# Patient Record
Sex: Female | Born: 1975 | Race: White | Hispanic: No | Marital: Married | Smoking: Former smoker
Health system: Southern US, Community
[De-identification: ages and names within clinical notes are randomized; demographics above are authoritative.]

## PROBLEM LIST (undated history)

## (undated) DIAGNOSIS — I10 Essential (primary) hypertension: Secondary | ICD-10-CM

## (undated) DIAGNOSIS — F17200 Nicotine dependence, unspecified, uncomplicated: Secondary | ICD-10-CM

## (undated) DIAGNOSIS — F419 Anxiety disorder, unspecified: Secondary | ICD-10-CM

## (undated) DIAGNOSIS — G709 Myoneural disorder, unspecified: Secondary | ICD-10-CM

## (undated) DIAGNOSIS — M3390 Dermatopolymyositis, unspecified, organ involvement unspecified: Secondary | ICD-10-CM

## (undated) DIAGNOSIS — M3313 Other dermatomyositis without myopathy: Secondary | ICD-10-CM

## (undated) DIAGNOSIS — K219 Gastro-esophageal reflux disease without esophagitis: Secondary | ICD-10-CM

## (undated) HISTORY — PX: DILATION AND CURETTAGE OF UTERUS: SHX78

## (undated) HISTORY — PX: TONSILLECTOMY: SUR1361

## (undated) HISTORY — PX: TUBAL LIGATION: SHX77

---

## 2005-07-16 ENCOUNTER — Emergency Department (HOSPITAL_COMMUNITY): Admission: EM | Admit: 2005-07-16 | Discharge: 2005-07-16 | Payer: Self-pay | Admitting: Emergency Medicine

## 2006-12-05 ENCOUNTER — Emergency Department (HOSPITAL_COMMUNITY): Admission: EM | Admit: 2006-12-05 | Discharge: 2006-12-05 | Payer: Self-pay | Admitting: Emergency Medicine

## 2007-05-19 ENCOUNTER — Emergency Department (HOSPITAL_COMMUNITY): Admission: EM | Admit: 2007-05-19 | Discharge: 2007-05-19 | Payer: Self-pay | Admitting: Emergency Medicine

## 2011-03-26 ENCOUNTER — Encounter (HOSPITAL_COMMUNITY): Payer: Self-pay | Admitting: *Deleted

## 2011-03-26 ENCOUNTER — Emergency Department (HOSPITAL_COMMUNITY): Payer: Commercial Managed Care - PPO

## 2011-03-26 ENCOUNTER — Emergency Department (HOSPITAL_COMMUNITY)
Admission: EM | Admit: 2011-03-26 | Discharge: 2011-03-26 | Disposition: A | Discharge status: Home / Self Care | Payer: Commercial Managed Care - PPO | Attending: Emergency Medicine | Admitting: Emergency Medicine

## 2011-03-26 DIAGNOSIS — F411 Generalized anxiety disorder: Secondary | ICD-10-CM | POA: Insufficient documentation

## 2011-03-26 DIAGNOSIS — I10 Essential (primary) hypertension: Secondary | ICD-10-CM | POA: Insufficient documentation

## 2011-03-26 DIAGNOSIS — K810 Acute cholecystitis: Secondary | ICD-10-CM | POA: Insufficient documentation

## 2011-03-26 DIAGNOSIS — F17200 Nicotine dependence, unspecified, uncomplicated: Secondary | ICD-10-CM | POA: Insufficient documentation

## 2011-03-26 HISTORY — DX: Anxiety disorder, unspecified: F41.9

## 2011-03-26 HISTORY — DX: Essential (primary) hypertension: I10

## 2011-03-26 LAB — DIFFERENTIAL
Basophils Absolute: 0 10*3/uL (ref 0.0–0.1)
Basophils Relative: 0 % (ref 0–1)
Eosinophils Absolute: 0.1 10*3/uL (ref 0.0–0.7)
Lymphocytes Relative: 20 % (ref 12–46)
Lymphs Abs: 1.7 10*3/uL (ref 0.7–4.0)
Monocytes Relative: 4 % (ref 3–12)

## 2011-03-26 LAB — URINE MICROSCOPIC-ADD ON

## 2011-03-26 LAB — COMPREHENSIVE METABOLIC PANEL
ALT: 146 U/L — ABNORMAL HIGH (ref 0–35)
Albumin: 3.9 g/dL (ref 3.5–5.2)
CO2: 22 mEq/L (ref 19–32)
Calcium: 9 mg/dL (ref 8.4–10.5)
GFR calc non Af Amer: >90 mL/min (ref >90)
Glucose, Bld: 93 mg/dL (ref 70–99)
Potassium: 4 mEq/L (ref 3.5–5.1)

## 2011-03-26 LAB — URINALYSIS, ROUTINE W REFLEX MICROSCOPIC
Bilirubin Urine: NEGATIVE
Ketones, ur: NEGATIVE mg/dL
Protein, ur: NEGATIVE mg/dL
Urobilinogen, UA: 0.2 mg/dL (ref 0.0–1.0)
pH: 6 (ref 5.0–8.0)

## 2011-03-26 LAB — CBC
HCT: 39 % (ref 36.0–46.0)
Hemoglobin: 13.2 g/dL (ref 12.0–15.0)
MCH: 30.3 pg (ref 26.0–34.0)
WBC: 8.2 10*3/uL (ref 4.0–10.5)

## 2011-03-26 MED ORDER — HYDROCODONE-ACETAMINOPHEN 5-325 MG PO TABS: 1.0000 | ORAL_TABLET | ORAL | Status: DC | PRN

## 2011-03-26 MED ORDER — IOHEXOL 300 MG/ML  SOLN
40.0000 mL | Freq: Once | INTRAMUSCULAR | Status: AC | PRN
  Administered 2011-03-26: 40 mL via ORAL

## 2011-03-26 MED ORDER — IOHEXOL 300 MG/ML  SOLN
100.0000 mL | Freq: Once | INTRAMUSCULAR | Status: AC | PRN
  Administered 2011-03-26: 100 mL via INTRAVENOUS

## 2011-03-26 MED ORDER — SODIUM CHLORIDE 0.9 % IV SOLN
INTRAVENOUS | Status: DC
  Administered 2011-03-26: 18:00:00 via INTRAVENOUS

## 2011-03-26 MED ORDER — HYDROMORPHONE HCL PF 1 MG/ML IJ SOLN
1.0000 mg | Freq: Once | INTRAMUSCULAR | Status: AC
  Administered 2011-03-26: 1 mg via INTRAVENOUS
  Filled 2011-03-26: qty 1

## 2011-03-26 MED ORDER — ONDANSETRON HCL 4 MG/2ML IJ SOLN
4.0000 mg | Freq: Once | INTRAMUSCULAR | Status: AC
  Administered 2011-03-26: 4 mg via INTRAVENOUS
  Filled 2011-03-26: qty 2

## 2011-03-26 NOTE — ED Notes (Signed)
Pt c/o sharp stabbing abd pain that woke her in the night.

## 2011-03-26 NOTE — ED Notes (Signed)
MD at bedside. 

## 2011-03-26 NOTE — ED Notes (Signed)
Pt up to the bathroom at this time.

## 2011-03-26 NOTE — ED Provider Notes (Cosign Needed)
History     CSN: [id number]  Arrival date & time 03/26/11  1310   First MD Initiated Contact with Patient 03/26/11 1703      Chief Complaint  Patient presents with  . Abdominal Pain  . Nausea    (Consider location/radiation/quality/duration/timing/severity/associated sxs/prior treatment) HPI Comments: Patient is a 36 year old woman who went to bed all right last night. She woke up in the middle of the night with a sharp intense pain, felt just above her umbilicus. The pain radiated around to the sides and into her back. The pain eases up and then becomes severe he can. She doesn't think it was caused by something she ate. She has not been anything today. She had no prior similar episode of pain.  Patient is a 36 y.o. female presenting with abdominal pain. The history is provided by the patient and the spouse. No language interpreter was used.  Abdominal Pain The primary symptoms of the illness include abdominal pain. The primary symptoms of the illness do not include fever, nausea, vomiting, diarrhea or dysuria. The current episode started 13 to 24 hours ago. The onset of the illness was sudden. Progression since onset: Abdominal pain waxes and wanes.  The illness is associated with awakening from sleep. The patient states that she believes she is currently not pregnant. The patient has not had a change in bowel habit. Additional symptoms associated with the illness include anorexia. Symptoms associated with the illness do not include chills.    Past Medical History  Diagnosis Date  . Anxiety   . Hypertension     History reviewed. No pertinent past surgical history.  History reviewed. No pertinent family history.  History  Substance Use Topics  . Smoking status: Current Everyday Smoker -- 1.0 packs/day  . Smokeless tobacco: Not on file  . Alcohol Use: No    OB History    Grav Para Term Preterm Abortions TAB SAB Ect Mult Living                  Review of Systems    Constitutional: Negative.  Negative for fever and chills.  HENT: Negative.   Eyes: Negative.   Respiratory: Negative.   Cardiovascular: Negative.   Gastrointestinal: Positive for abdominal pain and anorexia. Negative for nausea, vomiting and diarrhea.  Genitourinary: Negative.  Negative for dysuria and menstrual problem.       Her last period was about 2 and half weeks ago.  Musculoskeletal: Negative.   Neurological: Negative.   Psychiatric/Behavioral: Negative.     Allergies  Review of patient's allergies indicates no known allergies.  Home Medications  No current outpatient prescriptions on file.  BP 120/82  Pulse 73  Temp(Src) 98 F (36.7 C) (Oral)  Resp 18  Ht 5\' 8"  (1.727 m)  Wt 150 lb (68.04 kg)  BMI 22.81 kg/m2  SpO2 99%  LMP 03/13/2011  Physical Exam  Constitutional: She is oriented to person, place, and time. She appears well-developed and well-nourished.       And mild-moderate distress with abdominal pain.  HENT:  Head: Normocephalic and atraumatic.  Right Ear: External ear normal.  Left Ear: External ear normal.  Mouth/Throat: Oropharynx is clear and moist.  Eyes: Conjunctivae and EOM are normal. Pupils are equal, round, and reactive to light.  Neck: Normal range of motion. Neck supple.  Cardiovascular: Normal rate, regular rhythm and normal heart sounds.   Pulmonary/Chest: Effort normal and breath sounds normal.  Abdominal: Soft. Bowel sounds are normal.  She localizes the pain to the lower epigastric region, just above the umbilicus. There is mild tenderness there. There is no mass rebound or rigidity. Bowel sounds are hypoactive.  Musculoskeletal: Normal range of motion. She exhibits no edema and no tenderness.  Neurological: She is alert and oriented to person, place, and time.       No sensory or motor deficit.  Skin: Skin is warm and dry.  Psychiatric: She has a normal mood and affect. Her behavior is normal.    ED Course  Procedures  (including critical care time)   Labs Reviewed  CBC  DIFFERENTIAL  URINALYSIS, ROUTINE W REFLEX MICROSCOPIC  PREGNANCY, URINE  COMPREHENSIVE METABOLIC PANEL  LIPASE, BLOOD   5:34 PM Patient was seen, history was obtained and physical exam performed. IV fluids were, and IV medications for pain and nausea were ordered. Laboratory testing and CT of the abdomen and pelvis were ordered.  CT of abdomen and pelvis showed a solitary gallstone.  LFT's slightly elevated.  Discussed with Franky Macho, M.D., general surgeon, who will see pt in office at 2 PM on March 28, 2011.  Return instructions given to pt in event her pain recurs and is not controlled by pain medication.  1. Acute cholecystitis        Carleene Cooper III, MD 03/27/11 1039

## 2011-03-28 NOTE — H&P (Signed)
  NTS SOAP Note  Vital Signs:  Vitals as of: 03/28/2011: Systolic 134: Diastolic 82: Heart Rate 72: Temp 42F: Height 77ft 8in: Weight 156Lbs 0 Ounces: OFC 0in: Respiratory Rate 0: O2 Saturation 0: Pain Level 4: BMI 24  BMI : 23.72 kg/m2  Subjective: This 36 Years 86 Months old Female presents for of ABDOMINAL PAIN : ,Seen in ER several days ago for abdominal pain and nausea and found to have cholelithiasis with elevated LFT's.  No fever, chills, jaundice.  Started last week.  Review of Symptoms:  Constitutional:unremarkable Head:unremarkable Eyes:unremarkable Nose/Mouth/Throat:unremarkable Cardiovascular:unremarkable Respiratory:unremarkable Gastrointestinabdominal pain,nausea,vomiting Genitourinary:unremarkable Musculoskeletal:unremarkable Skin:unremarkable Hematolgic/Lymphatic:unremarkable Allergic/Immunologic:unremarkable   Past Medical History:Reviewed   Past Medical History  Surgical History: c- sections, T and A Medical Problems: HTN Psychiatric History:  Anxiety Allergies: nkda Medications: wellbutrin, bystolic, xanax, hydrocodone   Social History:Reviewed   Social History  Preferred Language: English ([place]) Race:  White Ethnicity: Not Hispanic / Latino Age: 36 Years 0 Months Marital Status:  M Alcohol:  No Recreational drug(s):  No   Smoking Status: Current every day smoker reviewed on 03/28/2011 Started Date: 02/27/1993 Packs per day: 1.00   Family History:Reviewed   Family History              Father:  Psychologist, counselling, lymphoma             Mother:  Hypertension, Diabetes Type II, Coronary Artery Disease    Objective Information: General:Well appearing, well nourished in no distress. Head:Atraumatic; no masses; no abnormalities no scleral icterus Neck:Supple without lymphadenopathy.  Heart:RRR, no murmur or gallop.  Normal S1, S2.  No S3, S4.  Lungs:CTA bilaterally, no  wheezes, rhonchi, rales.  Breathing unlabored. Abdomen:Soft, tender in right upper quadrant to deep palpation, ND, normal bowel sounds, no HSM, no masses.  No peritoneal signs.  Assessment:Cholecystitis, cholelithiasis  Diagnosis &amp; Procedure: DiagnosisCode: 574.10, ProcedureCode: 16109,    Plan:Scheduled for laparoscopic cholecysectomy with cholangiograms on 03/31/11.   Patient Education:Alternative treatments to surgery were discussed with patient (and family).Risks and benefits  of procedure were fully explained to the patient (and family) who gave informed consent. Patient/family questions were addressed.  Follow-up:Pending Surgery

## 2011-03-29 ENCOUNTER — Encounter (HOSPITAL_COMMUNITY): Payer: Self-pay | Admitting: Pharmacy Technician

## 2011-03-30 ENCOUNTER — Encounter (HOSPITAL_COMMUNITY)
Admission: RE | Admit: 2011-03-30 | Discharge: 2011-03-30 | Disposition: A | Discharge status: Home / Self Care | Payer: Commercial Managed Care - PPO | Source: Ambulatory Visit | Attending: General Surgery | Admitting: General Surgery

## 2011-03-30 ENCOUNTER — Encounter (HOSPITAL_COMMUNITY): Payer: Self-pay

## 2011-03-30 ENCOUNTER — Other Ambulatory Visit: Payer: Self-pay

## 2011-03-30 HISTORY — DX: Gastro-esophageal reflux disease without esophagitis: K21.9

## 2011-03-30 LAB — SURGICAL PCR SCREEN: MRSA, PCR: NEGATIVE

## 2011-03-30 LAB — HEPATIC FUNCTION PANEL
Bilirubin, Direct: <0.1 mg/dL (ref 0.0–0.3)
Total Bilirubin: 0.2 mg/dL — ABNORMAL LOW (ref 0.3–1.2)

## 2011-03-30 NOTE — Patient Instructions (Signed)
18 Kathryn Austin  03/30/2011   Your procedure is scheduled on:  03/31/2011  Report to [facility] at [place] [facility].  Call this number if you have problems the morning of surgery: [phone]   Remember:   Do not eat food:After Midnight.  May have clear liquids:until Midnight .  Clear liquids include soda, tea, black coffee, apple or grape juice, broth.  Take these medicines the morning of surgery with A SIP OF WATER: Bupropion,nebivolol. Take Xanax and Hydrocodone, if needed.   Do not wear jewelry, make-up or nail polish.  Do not wear lotions, powders, or perfumes. You may wear deodorant.  Do not shave 48 hours prior to surgery.  Do not bring valuables to the hospital.  Contacts, dentures or bridgework may not be worn into surgery.  Leave suitcase in the car. After surgery it may be brought to your room.  For patients admitted to the hospital, checkout time is 11:00 AM the day of discharge.   Patients discharged the day of surgery will not be allowed to drive home.  Name and phone number of your driver: Family  Special Instructions: CHG Shower Use Special Wash: 1/2 bottle night before surgery and 1/2 bottle morning of surgery.   Please read over the following fact sheets that you were given: Pain Booklet, MRSA Information, Surgical Site Infection Prevention, Anesthesia Post-op Instructions and Care and Recovery After Surgery    Laparoscopic Cholecystectomy Laparoscopic cholecystectomy is surgery to remove the gallbladder. The gallbladder is located slightly to the right of center in the abdomen, behind the liver. It is a concentrating and storage sac for the bile produced in the liver. Bile aids in the digestion and absorption of fats. Gallbladder disease (cholecystitis) is an inflammation of your gallbladder. This condition is usually caused by a buildup of gallstones (cholelithiasis) in your gallbladder. Gallstones can block the flow of bile, resulting in inflammation and pain. In severe  cases, emergency surgery may be required. When emergency surgery is not required, you will have time to prepare for the procedure. Laparoscopic surgery is an alternative to open surgery. Laparoscopic surgery usually has a shorter recovery time. Your common bile duct may also need to be examined and explored. Your caregiver will discuss this with you if he or she feels this should be done. If stones are found in the common bile duct, they may be removed. LET YOUR CAREGIVER KNOW ABOUT:  Allergies to food or medicine.   Medicines taken, including vitamins, herbs, eyedrops, over-the-counter medicines, and creams.   Use of steroids (by mouth or creams).   Previous problems with anesthetics or numbing medicines.   History of bleeding problems or blood clots.   Previous surgery.   Other health problems, including diabetes and kidney problems.   Possibility of pregnancy, if this applies.  RISKS AND COMPLICATIONS All surgery is associated with risks. Some problems that may occur following this procedure include:  Infection.   Damage to the common bile duct, nerves, arteries, veins, or other internal organs such as the stomach or intestines.   Bleeding.   A stone may remain in the common bile duct.  BEFORE THE PROCEDURE  Do not take aspirin for 3 days prior to surgery or blood thinners for 1 week prior to surgery.   Do not eat or drink anything after midnight the night before surgery.   Let your caregiver know if you develop a cold or other infectious problem prior to surgery.   You should be present 60 minutes  before the procedure or as directed.  PROCEDURE  You will be given medicine that makes you sleep (general anesthetic). When you are asleep, your surgeon will make several small cuts (incisions) in your abdomen. One of these incisions is used to insert a small, lighted scope (laparoscope) into the abdomen. The laparoscope helps the surgeon see into your abdomen. Carbon dioxide gas  will be pumped into your abdomen. The gas allows more room for the surgeon to perform your surgery. Other operating instruments are inserted through the other incisions. Laparoscopic procedures may not be appropriate when:  There is major scarring from previous surgery.   The gallbladder is extremely inflamed.   There are bleeding disorders or unexpected cirrhosis of the liver.   A pregnancy is near term.   Other conditions make the laparoscopic procedure impossible.  If your surgeon feels it is not safe to continue with a laparoscopic procedure, he or she will perform an open abdominal procedure. In this case, the surgeon will make an incision to open the abdomen. This gives the surgeon a larger view and field to work within. This may allow the surgeon to perform procedures that sometimes cannot be performed with a laparoscope alone. Open surgery has a longer recovery time. AFTER THE PROCEDURE  You will be taken to the recovery area where a nurse will watch and check your progress.   You may be allowed to go home the same day.   Do not resume physical activities until directed by your caregiver.   You may resume a normal diet and activities as directed.  Document Released: 02/13/2005 Document Revised: 10/26/2010 Document Reviewed: 07/29/2010 [facility] Patient Information 2012 [place], [place].   PATIENT INSTRUCTIONS POST-ANESTHESIA  IMMEDIATELY FOLLOWING SURGERY:  Do not drive or operate machinery for the first twenty four hours after surgery.  Do not make any important decisions for twenty four hours after surgery or while taking narcotic pain medications or sedatives.  If you develop intractable nausea and vomiting or a severe headache please notify your doctor immediately.  FOLLOW-UP:  Please make an appointment with your surgeon as instructed. You do not need to follow up with anesthesia unless specifically instructed to do so.  WOUND CARE INSTRUCTIONS (if applicable):  Keep a dry  clean dressing on the anesthesia/puncture wound site if there is drainage.  Once the wound has quit draining you may leave it open to air.  Generally you should leave the bandage intact for twenty four hours unless there is drainage.  If the epidural site drains for more than 36-48 hours please call the anesthesia department.  QUESTIONS?:  Please feel free to call your physician or the hospital operator if you have any questions, and they will be happy to assist you.     [facility] Anesthesia Department [address] [place] [phone]

## 2011-03-31 ENCOUNTER — Encounter (HOSPITAL_COMMUNITY): Payer: Self-pay | Admitting: *Deleted

## 2011-03-31 ENCOUNTER — Ambulatory Visit (HOSPITAL_COMMUNITY): Payer: Commercial Managed Care - PPO | Admitting: Anesthesiology

## 2011-03-31 ENCOUNTER — Ambulatory Visit (HOSPITAL_COMMUNITY)
Admission: RE | Admit: 2011-03-31 | Discharge: 2011-03-31 | Disposition: A | Discharge status: Home / Self Care | Payer: Commercial Managed Care - PPO | Source: Ambulatory Visit | Attending: General Surgery | Admitting: General Surgery

## 2011-03-31 ENCOUNTER — Encounter (HOSPITAL_COMMUNITY)
Admission: RE | Disposition: A | Discharge status: Home / Self Care | Payer: Self-pay | Source: Ambulatory Visit | Attending: General Surgery

## 2011-03-31 ENCOUNTER — Encounter (HOSPITAL_COMMUNITY): Payer: Self-pay | Admitting: Anesthesiology

## 2011-03-31 ENCOUNTER — Other Ambulatory Visit: Payer: Self-pay | Admitting: General Surgery

## 2011-03-31 DIAGNOSIS — I10 Essential (primary) hypertension: Secondary | ICD-10-CM | POA: Insufficient documentation

## 2011-03-31 DIAGNOSIS — K8010 Calculus of gallbladder with chronic cholecystitis without obstruction: Secondary | ICD-10-CM | POA: Insufficient documentation

## 2011-03-31 DIAGNOSIS — Z79899 Other long term (current) drug therapy: Secondary | ICD-10-CM | POA: Insufficient documentation

## 2011-03-31 DIAGNOSIS — Z01810 Encounter for preprocedural cardiovascular examination: Secondary | ICD-10-CM | POA: Insufficient documentation

## 2011-03-31 DIAGNOSIS — Z01812 Encounter for preprocedural laboratory examination: Secondary | ICD-10-CM | POA: Insufficient documentation

## 2011-03-31 HISTORY — PX: CHOLECYSTECTOMY: SHX55

## 2011-03-31 SURGERY — LAPAROSCOPIC CHOLECYSTECTOMY
Anesthesia: General | Wound class: Clean Contaminated

## 2011-03-31 MED ORDER — FENTANYL CITRATE 0.05 MG/ML IJ SOLN
INTRAMUSCULAR | Status: DC | PRN
  Administered 2011-03-31 (×4): 50 ug via INTRAVENOUS

## 2011-03-31 MED ORDER — 0.9 % SODIUM CHLORIDE (POUR BTL) OPTIME
TOPICAL | Status: DC | PRN
  Administered 2011-03-31: 1000 mL

## 2011-03-31 MED ORDER — GLYCOPYRROLATE 0.2 MG/ML IJ SOLN
INTRAMUSCULAR | Status: DC | PRN
  Administered 2011-03-31: .6 mg via INTRAVENOUS

## 2011-03-31 MED ORDER — PROPOFOL 10 MG/ML IV BOLUS
INTRAVENOUS | Status: DC | PRN
  Administered 2011-03-31: 150 mg via INTRAVENOUS

## 2011-03-31 MED ORDER — ONDANSETRON HCL 4 MG/2ML IJ SOLN
INTRAMUSCULAR | Status: AC
  Administered 2011-03-31: 4 mg via INTRAVENOUS
  Filled 2011-03-31: qty 2

## 2011-03-31 MED ORDER — HEMOSTATIC AGENTS (NO CHARGE) OPTIME
TOPICAL | Status: DC | PRN
  Administered 2011-03-31: 1 via TOPICAL

## 2011-03-31 MED ORDER — CEFAZOLIN SODIUM-DEXTROSE 2-3 GM-% IV SOLR: 2.0000 g | INTRAVENOUS | Status: DC

## 2011-03-31 MED ORDER — ENOXAPARIN SODIUM 40 MG/0.4ML ~~LOC~~ SOLN
40.0000 mg | Freq: Once | SUBCUTANEOUS | Status: AC
  Administered 2011-03-31: 40 mg via SUBCUTANEOUS

## 2011-03-31 MED ORDER — FENTANYL CITRATE 0.05 MG/ML IJ SOLN
INTRAMUSCULAR | Status: AC
  Administered 2011-03-31: 25 ug via INTRAVENOUS
  Filled 2011-03-31: qty 2

## 2011-03-31 MED ORDER — GLYCOPYRROLATE 0.2 MG/ML IJ SOLN
INTRAMUSCULAR | Status: AC
  Administered 2011-03-31: 0.2 mg via INTRAVENOUS
  Filled 2011-03-31: qty 1

## 2011-03-31 MED ORDER — ENOXAPARIN SODIUM 40 MG/0.4ML ~~LOC~~ SOLN
SUBCUTANEOUS | Status: AC
  Administered 2011-03-31: 40 mg via SUBCUTANEOUS
  Filled 2011-03-31: qty 0.4

## 2011-03-31 MED ORDER — FENTANYL CITRATE 0.05 MG/ML IJ SOLN
INTRAMUSCULAR | Status: AC
  Administered 2011-03-31: 50 ug via INTRAVENOUS
  Filled 2011-03-31: qty 2

## 2011-03-31 MED ORDER — LACTATED RINGERS IV SOLN
INTRAVENOUS | Status: DC
  Administered 2011-03-31: 1000 mL via INTRAVENOUS

## 2011-03-31 MED ORDER — CEFAZOLIN SODIUM 1-5 GM-% IV SOLN
INTRAVENOUS | Status: AC
  Filled 2011-03-31: qty 100

## 2011-03-31 MED ORDER — LIDOCAINE HCL 1 % IJ SOLN
INTRAMUSCULAR | Status: DC | PRN
  Administered 2011-03-31: 50 mg via INTRADERMAL

## 2011-03-31 MED ORDER — MIDAZOLAM HCL 2 MG/2ML IJ SOLN
INTRAMUSCULAR | Status: AC
  Administered 2011-03-31: 2 mg via INTRAVENOUS
  Filled 2011-03-31: qty 2

## 2011-03-31 MED ORDER — NEOSTIGMINE METHYLSULFATE 1 MG/ML IJ SOLN
INTRAMUSCULAR | Status: AC
  Filled 2011-03-31: qty 10

## 2011-03-31 MED ORDER — ONDANSETRON HCL 4 MG/2ML IJ SOLN
4.0000 mg | Freq: Once | INTRAMUSCULAR | Status: AC
  Administered 2011-03-31: 4 mg via INTRAVENOUS

## 2011-03-31 MED ORDER — ROCURONIUM BROMIDE 100 MG/10ML IV SOLN
INTRAVENOUS | Status: DC | PRN
  Administered 2011-03-31: 30 mg via INTRAVENOUS

## 2011-03-31 MED ORDER — BUPIVACAINE-EPINEPHRINE (PF) 0.5% -1:200000 IJ SOLN
INTRAMUSCULAR | Status: DC | PRN
  Administered 2011-03-31: 10 mL

## 2011-03-31 MED ORDER — HYDROCODONE-ACETAMINOPHEN 5-325 MG PO TABS: 1.0000 | ORAL_TABLET | ORAL | Status: AC | PRN

## 2011-03-31 MED ORDER — ONDANSETRON HCL 4 MG/2ML IJ SOLN: 4.0000 mg | Freq: Once | INTRAMUSCULAR | Status: DC | PRN

## 2011-03-31 MED ORDER — MIDAZOLAM HCL 2 MG/2ML IJ SOLN
1.0000 mg | INTRAMUSCULAR | Status: DC | PRN
  Administered 2011-03-31: 2 mg via INTRAVENOUS

## 2011-03-31 MED ORDER — CEFAZOLIN SODIUM 1-5 GM-% IV SOLN
INTRAVENOUS | Status: DC | PRN
  Administered 2011-03-31: 2 g via INTRAVENOUS

## 2011-03-31 MED ORDER — FENTANYL CITRATE 0.05 MG/ML IJ SOLN
INTRAMUSCULAR | Status: AC
  Filled 2011-03-31: qty 5

## 2011-03-31 MED ORDER — GLYCOPYRROLATE 0.2 MG/ML IJ SOLN
0.2000 mg | Freq: Once | INTRAMUSCULAR | Status: AC
  Administered 2011-03-31: 0.2 mg via INTRAVENOUS

## 2011-03-31 MED ORDER — BUPIVACAINE HCL (PF) 0.5 % IJ SOLN
INTRAMUSCULAR | Status: AC
  Filled 2011-03-31: qty 30

## 2011-03-31 MED ORDER — NEOSTIGMINE METHYLSULFATE 1 MG/ML IJ SOLN
INTRAMUSCULAR | Status: DC | PRN
  Administered 2011-03-31: 3 mg via INTRAVENOUS

## 2011-03-31 MED ORDER — KETOROLAC TROMETHAMINE 30 MG/ML IJ SOLN
INTRAMUSCULAR | Status: AC
  Administered 2011-03-31: 30 mg via INTRAVENOUS
  Filled 2011-03-31: qty 1

## 2011-03-31 MED ORDER — FENTANYL CITRATE 0.05 MG/ML IJ SOLN
25.0000 ug | INTRAMUSCULAR | Status: DC | PRN
  Administered 2011-03-31: 50 ug via INTRAVENOUS
  Administered 2011-03-31: 25 ug via INTRAVENOUS
  Administered 2011-03-31: 50 ug via INTRAVENOUS

## 2011-03-31 MED ORDER — GLYCOPYRROLATE 0.2 MG/ML IJ SOLN
INTRAMUSCULAR | Status: AC
  Filled 2011-03-31: qty 2

## 2011-03-31 MED ORDER — ACETAMINOPHEN 325 MG PO TABS: 325.0000 mg | ORAL_TABLET | ORAL | Status: DC | PRN

## 2011-03-31 MED ORDER — KETOROLAC TROMETHAMINE 30 MG/ML IJ SOLN
30.0000 mg | Freq: Once | INTRAMUSCULAR | Status: AC
  Administered 2011-03-31: 30 mg via INTRAVENOUS

## 2011-03-31 SURGICAL SUPPLY — 43 items
APPLIER CLIP ROT 10 11.4 M/L (STAPLE) ×2
APR CLP MED LRG 11.4X10 (STAPLE) ×1
BAG HAMPER (MISCELLANEOUS) ×2 IMPLANT
BAG SPEC RTRVL LRG 6X4 10 (ENDOMECHANICALS) ×1
CATH CHOLANGIOGRAM 4.5FR (CATHETERS) ×1 IMPLANT
CLIP APPLIE ROT 10 11.4 M/L (STAPLE) ×1 IMPLANT
CLOTH BEACON ORANGE TIMEOUT ST (SAFETY) ×2 IMPLANT
COVER LIGHT HANDLE STERIS (MISCELLANEOUS) ×4 IMPLANT
COVER MAYO STAND XLG (DRAPE) ×2 IMPLANT
DECANTER SPIKE VIAL GLASS SM (MISCELLANEOUS) ×1 IMPLANT
DISSECTOR BLUNT TIP ENDO 5MM (MISCELLANEOUS) IMPLANT
DRAPE C-ARM FOLDED MOBILE STRL (DRAPES) ×1 IMPLANT
DURAPREP 26ML APPLICATOR (WOUND CARE) ×2 IMPLANT
ELECT REM PT RETURN 9FT ADLT (ELECTROSURGICAL) ×2
ELECTRODE REM PT RTRN 9FT ADLT (ELECTROSURGICAL) ×1 IMPLANT
FILTER SMOKE EVAC LAPAROSHD (FILTER) ×2 IMPLANT
FORMALIN 10 PREFIL 120ML (MISCELLANEOUS) ×2 IMPLANT
GLOVE BIO SURGEON STRL SZ7.5 (GLOVE) ×2 IMPLANT
GLOVE BIOGEL PI IND STRL 7.0 (GLOVE) IMPLANT
GLOVE BIOGEL PI IND STRL 7.5 (GLOVE) IMPLANT
GLOVE BIOGEL PI INDICATOR 7.0 (GLOVE) ×1
GLOVE BIOGEL PI INDICATOR 7.5 (GLOVE) ×2
GLOVE ECLIPSE 6.5 STRL STRAW (GLOVE) ×1 IMPLANT
GLOVE ECLIPSE 7.0 STRL STRAW (GLOVE) ×2 IMPLANT
GOWN STRL REIN XL XLG (GOWN DISPOSABLE) ×7 IMPLANT
HEMOSTAT SNOW SURGICEL 2X4 (HEMOSTASIS) ×2 IMPLANT
INST SET LAPROSCOPIC AP (KITS) ×2 IMPLANT
KIT ROOM TURNOVER APOR (KITS) ×2 IMPLANT
KIT TROCAR LAP CHOLE (TROCAR) ×2 IMPLANT
MANIFOLD NEPTUNE II (INSTRUMENTS) ×2 IMPLANT
NS IRRIG 1000ML POUR BTL (IV SOLUTION) ×2 IMPLANT
PACK LAP CHOLE LZT030E (CUSTOM PROCEDURE TRAY) ×2 IMPLANT
PAD ARMBOARD 7.5X6 YLW CONV (MISCELLANEOUS) ×2 IMPLANT
POUCH SPECIMEN RETRIEVAL 10MM (ENDOMECHANICALS) ×2 IMPLANT
SET BASIN LINEN APH (SET/KITS/TRAYS/PACK) ×2 IMPLANT
SET TUBE IRRIG SUCTION NO TIP (IRRIGATION / IRRIGATOR) IMPLANT
SPONGE GAUZE 2X2 8PLY STRL LF (GAUZE/BANDAGES/DRESSINGS) ×8 IMPLANT
STAPLER VISISTAT (STAPLE) ×2 IMPLANT
SUT VICRYL 0 UR6 27IN ABS (SUTURE) ×2 IMPLANT
SYR 20CC LL (SYRINGE) ×2 IMPLANT
SYR 30ML LL (SYRINGE) ×2 IMPLANT
WARMER LAPAROSCOPE (MISCELLANEOUS) ×2 IMPLANT
YANKAUER SUCT 12FT TUBE ARGYLE (SUCTIONS) ×2 IMPLANT

## 2011-03-31 NOTE — Op Note (Signed)
Patient:  Kathryn Austin  DOB:  [birth year]  MRN:  [id number]   Preop Diagnosis:  Cholecystitis, cholelithiasis  Postop Diagnosis:  Same  Procedure:  Laparoscopic cholecystectomy  Surgeon:  Franky Macho, M.D.  Anes:  General endotracheal  Indications:  Patient is a 36 year old white female presents with biliary colic secondary to cholelithiasis. Her liver enzyme tests were normal at the time of surgery. The risks and benefits of the procedure including bleeding, infection, hepatobiliary injury, the possibly of an open procedure were fully explained to the patient, gave informed consent.  Procedure note:  Patient is placed the supine position. After induction of general endotracheal anesthesia, the abdomen was prepped and draped using usual sterile technique with DuraPrep. Surgical site confirmation was performed.  An infraumbilical incision was made down to the fascia. A Veress needle was introduced into the abdominal cavity and confirmation of placement was done using the saline drop test. The abdomen was then insufflated to 16 mm mercury pressure. An 11 mm trocar was introduced into the abdominal cavity under direct visualization without difficulty. Patient is placed in reverse Trendelenburg position additional 11 mm trocar was placed in the epigastric region 5 mm trochars placed were upper quadrant and right flank regions. Liver was inspected and noted within normal limits. The gallbladder was retracted superior laterally. Dissection was begun around the infundibulum of the gallbladder. The cystic duct was first identified. Junction to the infundibulum flow identified. Endoclips placed proximally distally on the cystic duct and cystic duct was divided. This was likewise done to the cystic artery. The gallbladder was then freed away from the gallbladder fossa using Bovie electrocautery. The gallbladder delivered through the epigastric are site using an Endo Catch bag. The gallbladder fossa was  inspected and no abnormal bleeding or bile leakage was noted. Surgicel is placed the gallbladder fossa. All fluid and air were then evacuated from the abdominal cavity prior to removal of the trochars.  All wounds were gave normal saline. All wounds were injected with 0.5% Sensorcaine. The infraumbilical fashion as well as epigastric fascia were reapproximated using 0 Vicryl interrupted sutures. All skin incisions were closed using staples. Betadine ointment after dressings were applied.  All tape and needle counts were correct at the end of the procedure. Patient was extubated in the operating room went back to recovery room awake in stable condition.  Complications:  None  EBL:  Minimal  Specimen:  Gallbladder

## 2011-03-31 NOTE — Anesthesia Preprocedure Evaluation (Addendum)
Anesthesia Evaluation  Patient identified by MRN, date of birth, ID band Patient awake    Reviewed: Allergy & Precautions, H&P , NPO status , Patient's Chart, lab work & pertinent test results, reviewed documented beta blocker date and time   Airway Mallampati: I TM Distance: >3 FB Neck ROM: Full    Dental No notable dental hx.    Pulmonary neg pulmonary ROS,    Pulmonary exam normal       Cardiovascular hypertension, Pt. on medications and Pt. on home beta blockers Regular Normal    Neuro/Psych Anxiety Negative Neurological ROS     GI/Hepatic Neg liver ROS, GERD-  ,  Endo/Other  Negative Endocrine ROS  Renal/GU negative Renal ROS  Genitourinary negative   Musculoskeletal negative musculoskeletal ROS (+)   Abdominal Normal abdominal exam  (+)   Peds  Hematology negative hematology ROS (+)   Anesthesia Other Findings   Reproductive/Obstetrics negative OB ROS                           Anesthesia Physical Anesthesia Plan  ASA: II  Anesthesia Plan: General   Post-op Pain Management:    Induction: Intravenous, Rapid sequence and Cricoid pressure planned  Airway Management Planned: Oral ETT  Additional Equipment:   Intra-op Plan:   Post-operative Plan: Extubation in OR  Informed Consent: I have reviewed the patients History and Physical, chart, labs and discussed the procedure including the risks, benefits and alternatives for the proposed anesthesia with the patient or authorized representative who has indicated his/her understanding and acceptance.   Dental advisory given  Plan Discussed with: CRNA  Anesthesia Plan Comments:         Anesthesia Quick Evaluation

## 2011-03-31 NOTE — Anesthesia Postprocedure Evaluation (Signed)
  Anesthesia Post-op Note  Patient: Kathryn Austin  Procedure(s) Performed:  LAPAROSCOPIC CHOLECYSTECTOMY  Patient Location: PACU  Anesthesia Type: General  Level of Consciousness: awake  Airway and Oxygen Therapy: Patient Spontanous Breathing and Patient connected to face mask oxygen  Post-op Pain: none  Post-op Assessment: Post-op Vital signs reviewed, Patient's Cardiovascular Status Stable, Respiratory Function Stable and No signs of Nausea or vomiting  Post-op Vital Signs: Reviewed and stable  Complications: No apparent anesthesia complications

## 2011-03-31 NOTE — Anesthesia Procedure Notes (Signed)
Procedure Name: Intubation Date/Time: 03/31/2011 7:56 AM Performed by: Glynn Octave Pre-anesthesia Checklist: Patient identified, Patient being monitored, Timeout performed, Emergency Drugs available and Suction available Patient Re-evaluated:Patient Re-evaluated prior to inductionOxygen Delivery Method: Circle System Utilized Preoxygenation: Pre-oxygenation with 100% oxygen Intubation Type: IV induction, Rapid sequence and Cricoid Pressure applied Laryngoscope Size: Mac and 3 Grade View: Grade I Tube type: Oral Tube size: 7.0 mm Number of attempts: 1 Airway Equipment and Method: stylet Placement Confirmation: ETT inserted through vocal cords under direct vision,  positive ETCO2 and breath sounds checked- equal and bilateral Secured at: 21 cm Tube secured with: Tape Dental Injury: Teeth and Oropharynx as per pre-operative assessment

## 2011-03-31 NOTE — Interval H&P Note (Signed)
History and Physical Interval Note:  03/31/2011 7:30 AM  Kathryn Austin  has presented today for surgery, with the diagnosis of Calculus of gallbladder with other cholecystitis, without mention of obstruction   The various methods of treatment have been discussed with the patient and family. After consideration of risks, benefits and other options for treatment, the patient has consented to  Procedure(s): LAPAROSCOPIC CHOLECYSTECTOMY WITH INTRAOPERATIVE CHOLANGIOGRAM as a surgical intervention .  The patients' history has been reviewed, patient examined, no change in status, stable for surgery.  I have reviewed the patients' chart and labs.  Questions were answered to the patient's satisfaction.     [name]

## 2011-03-31 NOTE — Transfer of Care (Signed)
Immediate Anesthesia Transfer of Care Note  Patient: Kathryn Austin  Procedure(s) Performed:  LAPAROSCOPIC CHOLECYSTECTOMY  Patient Location: PACU  Anesthesia Type: General  Level of Consciousness: awake, alert  and oriented  Airway & Oxygen Therapy: Patient Spontanous Breathing and Patient connected to face mask oxygen  Post-op Assessment: Report given to PACU RN  Post vital signs: Reviewed and stable  Complications: No apparent anesthesia complications

## 2011-04-03 ENCOUNTER — Encounter (HOSPITAL_COMMUNITY): Payer: Self-pay | Admitting: General Surgery

## 2011-08-09 ENCOUNTER — Other Ambulatory Visit (HOSPITAL_COMMUNITY): Payer: Self-pay | Admitting: Family Medicine

## 2011-08-09 DIAGNOSIS — R109 Unspecified abdominal pain: Secondary | ICD-10-CM

## 2011-08-15 ENCOUNTER — Other Ambulatory Visit (HOSPITAL_COMMUNITY): Payer: Self-pay | Admitting: Family Medicine

## 2011-08-15 ENCOUNTER — Ambulatory Visit (HOSPITAL_COMMUNITY)
Admission: RE | Admit: 2011-08-15 | Discharge: 2011-08-15 | Disposition: A | Discharge status: Home / Self Care | Payer: Commercial Managed Care - PPO | Source: Ambulatory Visit | Attending: Family Medicine | Admitting: Family Medicine

## 2011-08-15 DIAGNOSIS — N949 Unspecified condition associated with female genital organs and menstrual cycle: Secondary | ICD-10-CM | POA: Insufficient documentation

## 2011-08-15 DIAGNOSIS — R109 Unspecified abdominal pain: Secondary | ICD-10-CM

## 2011-08-15 DIAGNOSIS — R9389 Abnormal findings on diagnostic imaging of other specified body structures: Secondary | ICD-10-CM | POA: Insufficient documentation

## 2011-11-27 ENCOUNTER — Other Ambulatory Visit (HOSPITAL_COMMUNITY): Payer: Self-pay | Admitting: Physician Assistant

## 2011-11-27 DIAGNOSIS — R5381 Other malaise: Secondary | ICD-10-CM

## 2011-11-28 ENCOUNTER — Ambulatory Visit (HOSPITAL_COMMUNITY)
Admission: RE | Admit: 2011-11-28 | Discharge: 2011-11-28 | Disposition: A | Discharge status: Home / Self Care | Payer: Commercial Managed Care - PPO | Source: Ambulatory Visit | Attending: Physician Assistant | Admitting: Physician Assistant

## 2011-11-28 DIAGNOSIS — E079 Disorder of thyroid, unspecified: Secondary | ICD-10-CM | POA: Insufficient documentation

## 2011-11-28 DIAGNOSIS — R5383 Other fatigue: Secondary | ICD-10-CM | POA: Insufficient documentation

## 2011-11-28 DIAGNOSIS — R5381 Other malaise: Secondary | ICD-10-CM | POA: Insufficient documentation

## 2011-11-30 ENCOUNTER — Ambulatory Visit (HOSPITAL_COMMUNITY): Payer: Commercial Managed Care - PPO

## 2012-02-09 ENCOUNTER — Other Ambulatory Visit: Payer: Self-pay

## 2012-02-09 DIAGNOSIS — G4700 Insomnia, unspecified: Secondary | ICD-10-CM

## 2012-02-28 DIAGNOSIS — F17200 Nicotine dependence, unspecified, uncomplicated: Secondary | ICD-10-CM

## 2012-02-28 HISTORY — DX: Nicotine dependence, unspecified, uncomplicated: F17.200

## 2012-06-23 ENCOUNTER — Encounter (HOSPITAL_COMMUNITY): Payer: Self-pay | Admitting: *Deleted

## 2012-06-23 ENCOUNTER — Emergency Department (HOSPITAL_COMMUNITY): Payer: Commercial Managed Care - PPO

## 2012-06-23 ENCOUNTER — Emergency Department (HOSPITAL_COMMUNITY)
Admission: EM | Admit: 2012-06-23 | Discharge: 2012-06-23 | Disposition: A | Discharge status: Home / Self Care | Payer: Commercial Managed Care - PPO | Attending: Emergency Medicine | Admitting: Emergency Medicine

## 2012-06-23 DIAGNOSIS — R0609 Other forms of dyspnea: Secondary | ICD-10-CM | POA: Insufficient documentation

## 2012-06-23 DIAGNOSIS — R0989 Other specified symptoms and signs involving the circulatory and respiratory systems: Secondary | ICD-10-CM | POA: Insufficient documentation

## 2012-06-23 DIAGNOSIS — R21 Rash and other nonspecific skin eruption: Secondary | ICD-10-CM | POA: Insufficient documentation

## 2012-06-23 DIAGNOSIS — Z3202 Encounter for pregnancy test, result negative: Secondary | ICD-10-CM | POA: Insufficient documentation

## 2012-06-23 DIAGNOSIS — I10 Essential (primary) hypertension: Secondary | ICD-10-CM | POA: Insufficient documentation

## 2012-06-23 DIAGNOSIS — R5381 Other malaise: Secondary | ICD-10-CM | POA: Insufficient documentation

## 2012-06-23 DIAGNOSIS — R0602 Shortness of breath: Secondary | ICD-10-CM | POA: Insufficient documentation

## 2012-06-23 DIAGNOSIS — M25549 Pain in joints of unspecified hand: Secondary | ICD-10-CM | POA: Insufficient documentation

## 2012-06-23 DIAGNOSIS — R05 Cough: Secondary | ICD-10-CM | POA: Insufficient documentation

## 2012-06-23 DIAGNOSIS — IMO0001 Reserved for inherently not codable concepts without codable children: Secondary | ICD-10-CM | POA: Insufficient documentation

## 2012-06-23 DIAGNOSIS — R059 Cough, unspecified: Secondary | ICD-10-CM | POA: Insufficient documentation

## 2012-06-23 DIAGNOSIS — F17200 Nicotine dependence, unspecified, uncomplicated: Secondary | ICD-10-CM | POA: Insufficient documentation

## 2012-06-23 DIAGNOSIS — R0789 Other chest pain: Secondary | ICD-10-CM | POA: Insufficient documentation

## 2012-06-23 DIAGNOSIS — M2550 Pain in unspecified joint: Secondary | ICD-10-CM

## 2012-06-23 DIAGNOSIS — Z8719 Personal history of other diseases of the digestive system: Secondary | ICD-10-CM | POA: Insufficient documentation

## 2012-06-23 DIAGNOSIS — R110 Nausea: Secondary | ICD-10-CM | POA: Insufficient documentation

## 2012-06-23 DIAGNOSIS — Z79899 Other long term (current) drug therapy: Secondary | ICD-10-CM | POA: Insufficient documentation

## 2012-06-23 DIAGNOSIS — Z8619 Personal history of other infectious and parasitic diseases: Secondary | ICD-10-CM | POA: Insufficient documentation

## 2012-06-23 DIAGNOSIS — F411 Generalized anxiety disorder: Secondary | ICD-10-CM | POA: Insufficient documentation

## 2012-06-23 LAB — URINE MICROSCOPIC-ADD ON

## 2012-06-23 LAB — CBC WITH DIFFERENTIAL/PLATELET
Eosinophils Relative: 2 % (ref 0–5)
HCT: 38.3 % (ref 36.0–46.0)
Hemoglobin: 13.5 g/dL (ref 12.0–15.0)
Lymphocytes Relative: 25 % (ref 12–46)
Lymphs Abs: 2.3 10*3/uL (ref 0.7–4.0)
MCV: 88.7 fL (ref 78.0–100.0)
Monocytes Absolute: 0.3 10*3/uL (ref 0.1–1.0)
Platelets: 270 10*3/uL (ref 150–400)
RBC: 4.32 MIL/uL (ref 3.87–5.11)
WBC: 9.1 10*3/uL (ref 4.0–10.5)

## 2012-06-23 LAB — URINALYSIS, ROUTINE W REFLEX MICROSCOPIC
Glucose, UA: NEGATIVE mg/dL
Protein, ur: NEGATIVE mg/dL
Specific Gravity, Urine: <1.005 — ABNORMAL LOW (ref 1.005–1.030)

## 2012-06-23 LAB — POCT PREGNANCY, URINE: Preg Test, Ur: NEGATIVE

## 2012-06-23 LAB — BASIC METABOLIC PANEL
CO2: 26 mEq/L (ref 19–32)
Calcium: 8.7 mg/dL (ref 8.4–10.5)
Glucose, Bld: 90 mg/dL (ref 70–99)
Sodium: 137 mEq/L (ref 135–145)

## 2012-06-23 NOTE — ED Notes (Signed)
Pt c/o joint pain, weakness, rash, chest pain, chest burning, dry cough, muscle pains, nausea for over a year, worse over the past few weeks, has been seen by pcp several times with no diagnosis.

## 2012-06-23 NOTE — ED Provider Notes (Signed)
History  This chart was scribed for Kathryn Munch, MD by Ardelia Mems, ED Scribe. This patient was seen in room APA18/APA18 and the patient's care was started at 2:21 PM.  CSN: [id number]  Arrival date & time 06/23/12  1320    Chief Complaint  Patient presents with  . Joint Pain    The history is provided by the patient. No language interpreter was used.   HPI Comments: Kathryn Austin is a 37 y.o. female who presents to the Emergency Department complaining of recurrent generalized joint pain and muscle pain for the past year that has worsened over the past few weeks. There is associated fatigue, dyspnea, SOB, generalized rash, burning chest pain and dry cough. Pt states that she has been seen several times for same symptoms and has not received a diagnosis. 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 Pt reports negative test results for Lupus. Pt takes Wellbutrin, Adderall, Xanax and a BP medication. Pt denies recent long distance travel or camping trips. Patient has never had an echocardiogram. Pt denies alcohol use and is a current 0.25 pack/day smoker of 25 years.  Past Medical History  Diagnosis Date  . Anxiety   . Hypertension   . GERD (gastroesophageal reflux disease)     Past Surgical History  Procedure Laterality Date  . Cesarean section      x2  . Tonsillectomy    . Dilation and curettage of uterus    . Tubal ligation    . Cholecystectomy  03/31/2011    Procedure: LAPAROSCOPIC CHOLECYSTECTOMY;  Surgeon: Dalia Heading, MD;  Location: [facility];  Service: General;  Laterality: N/A;    Family History  Problem Relation Age of Onset  . Anesthesia problems Neg Hx   . Hypotension Neg Hx   . Malignant hyperthermia Neg Hx   . Pseudochol deficiency Neg Hx     History  Substance Use Topics  . Smoking status: Current Every Day Smoker -- 1.00 packs/day for 25 years    Types: Cigarettes  . Smokeless tobacco: Not on file  . Alcohol Use: No    OB  History   Grav Para Term Preterm Abortions TAB SAB Ect Mult Living                  Review of Systems  Constitutional:       Per HPI, otherwise negative  HENT:       Per HPI, otherwise negative  Respiratory:       Per HPI, otherwise negative  Cardiovascular:       Per HPI, otherwise negative  Gastrointestinal: Positive for nausea. Negative for vomiting.  Endocrine:       Negative aside from HPI  Genitourinary:       Neg aside from HPI   Musculoskeletal:       Per HPI, otherwise negative  Skin: Negative.   Neurological: Negative for syncope.    Allergies  Review of patient's allergies indicates no known allergies.  Home Medications   Current Outpatient Rx  Name  Route  Sig  Dispense  Refill  . ALPRAZolam (XANAX) 0.5 MG tablet   Oral   Take 0.5 mg by mouth daily as needed. anxiety         . buPROPion (WELLBUTRIN XL) 300 MG 24 hr tablet   Oral   Take 300 mg by mouth daily.         . Multiple Vitamin (MULITIVITAMIN WITH MINERALS) TABS  Oral   Take 1 tablet by mouth daily.         . nebivolol (BYSTOLIC) 5 MG tablet   Oral   Take 5 mg by mouth daily.           Triage Vitals: BP 119/80  Pulse 73  Temp(Src) 97.4 F (36.3 C) (Oral)  Resp 20  Ht 5\' 7"  (1.702 m)  Wt 134 lb (60.782 kg)  BMI 20.98 kg/m2  SpO2 100%  Physical Exam  Nursing note and vitals reviewed. Constitutional: She is oriented to person, place, and time. She appears well-developed and well-nourished. No distress.  HENT:  Head: Normocephalic and atraumatic.  Eyes: Conjunctivae and EOM are normal.  Cardiovascular: Normal rate and regular rhythm.   Pulmonary/Chest: Effort normal and breath sounds normal. No stridor. No respiratory distress.  Abdominal: She exhibits no distension.  Musculoskeletal: She exhibits no edema.  Mild distal erythema of hands bilaterally.  Neurological: She is alert and oriented to person, place, and time. No cranial nerve deficit.  Intact peripheral pulses.   Skin: Skin is warm and dry.  Psychiatric: She has a normal mood and affect.    ED Course  Procedures (including critical care time)  DIAGNOSTIC STUDIES: Oxygen Saturation is 100% on RA, normal by my interpretation.    COORDINATION OF CARE: 2:27 PM- Pt advised of plan for treatment and pt agrees.     Labs Reviewed - No data to display No results found.   No diagnosis found.  Update: She appears calm.  We discussed all results.  MDM   I personally performed the services described in this documentation, which was scribed in my presence. The recorded information has been reviewed and is accurate.  This female presents with multiple complaints, all of which have been present for some time.  On exam she is hemodynamic stable, awake, alert, oriented appropriately.  Given the patient's endorsement of reason for worsening of her symptoms she labs, radiographic studies.  These were largely reassuring.  We had a lengthy discussion on the need for further evaluation with his specialists given her description of complaints, her prior evaluation with primary care, prior treatment for Lyme disease.  Absent distress, significant abnormalities on vital signs were labs, she is appropriate for discharged with outpatient management.  Referral was provided.   Kathryn Munch, MD 06/23/12 1755

## 2012-06-23 NOTE — ED Notes (Signed)
Pt voices multiply complaints that have been going on for a year. States she has also been treated for lime disease recently

## 2012-07-26 ENCOUNTER — Encounter (HOSPITAL_COMMUNITY): Payer: Self-pay | Admitting: *Deleted

## 2012-07-26 ENCOUNTER — Other Ambulatory Visit (HOSPITAL_COMMUNITY): Payer: Self-pay | Admitting: Physician Assistant

## 2012-07-26 ENCOUNTER — Emergency Department (HOSPITAL_COMMUNITY): Payer: Commercial Managed Care - PPO

## 2012-07-26 ENCOUNTER — Emergency Department (HOSPITAL_COMMUNITY)
Admission: EM | Admit: 2012-07-26 | Discharge: 2012-07-26 | Disposition: A | Discharge status: Home / Self Care | Payer: Commercial Managed Care - PPO | Attending: Emergency Medicine | Admitting: Emergency Medicine

## 2012-07-26 DIAGNOSIS — F17200 Nicotine dependence, unspecified, uncomplicated: Secondary | ICD-10-CM | POA: Insufficient documentation

## 2012-07-26 DIAGNOSIS — M3390 Dermatopolymyositis, unspecified, organ involvement unspecified: Secondary | ICD-10-CM

## 2012-07-26 DIAGNOSIS — Z8719 Personal history of other diseases of the digestive system: Secondary | ICD-10-CM | POA: Insufficient documentation

## 2012-07-26 DIAGNOSIS — R0602 Shortness of breath: Secondary | ICD-10-CM | POA: Insufficient documentation

## 2012-07-26 DIAGNOSIS — R6883 Chills (without fever): Secondary | ICD-10-CM | POA: Insufficient documentation

## 2012-07-26 DIAGNOSIS — R059 Cough, unspecified: Secondary | ICD-10-CM | POA: Insufficient documentation

## 2012-07-26 DIAGNOSIS — I10 Essential (primary) hypertension: Secondary | ICD-10-CM | POA: Insufficient documentation

## 2012-07-26 DIAGNOSIS — R51 Headache: Secondary | ICD-10-CM | POA: Insufficient documentation

## 2012-07-26 DIAGNOSIS — M542 Cervicalgia: Secondary | ICD-10-CM | POA: Insufficient documentation

## 2012-07-26 DIAGNOSIS — R21 Rash and other nonspecific skin eruption: Secondary | ICD-10-CM | POA: Insufficient documentation

## 2012-07-26 DIAGNOSIS — F411 Generalized anxiety disorder: Secondary | ICD-10-CM | POA: Insufficient documentation

## 2012-07-26 DIAGNOSIS — R05 Cough: Secondary | ICD-10-CM | POA: Insufficient documentation

## 2012-07-26 DIAGNOSIS — Z79899 Other long term (current) drug therapy: Secondary | ICD-10-CM | POA: Insufficient documentation

## 2012-07-26 DIAGNOSIS — R55 Syncope and collapse: Secondary | ICD-10-CM | POA: Insufficient documentation

## 2012-07-26 DIAGNOSIS — R079 Chest pain, unspecified: Secondary | ICD-10-CM

## 2012-07-26 DIAGNOSIS — R42 Dizziness and giddiness: Secondary | ICD-10-CM | POA: Insufficient documentation

## 2012-07-26 HISTORY — DX: Nicotine dependence, unspecified, uncomplicated: F17.200

## 2012-07-26 LAB — CBC
HCT: 38.8 % (ref 36.0–46.0)
Hemoglobin: 13.5 g/dL (ref 12.0–15.0)
MCH: 31.4 pg (ref 26.0–34.0)
MCHC: 34.8 g/dL (ref 30.0–36.0)
MCV: 90.2 fL (ref 78.0–100.0)
RDW: 13.2 % (ref 11.5–15.5)

## 2012-07-26 LAB — BASIC METABOLIC PANEL
BUN: 9 mg/dL (ref 6–23)
Creatinine, Ser: 0.73 mg/dL (ref 0.50–1.10)
GFR calc Af Amer: >90 mL/min (ref >90)
GFR calc non Af Amer: >90 mL/min (ref >90)
Glucose, Bld: 99 mg/dL (ref 70–99)

## 2012-07-26 MED ORDER — MORPHINE SULFATE 2 MG/ML IJ SOLN
2.0000 mg | Freq: Once | INTRAMUSCULAR | Status: AC
  Administered 2012-07-26: 2 mg via INTRAVENOUS
  Filled 2012-07-26: qty 1

## 2012-07-26 MED ORDER — SODIUM CHLORIDE 0.9 % IV SOLN: INTRAVENOUS | Status: DC

## 2012-07-26 MED ORDER — SODIUM CHLORIDE 0.9 % IV BOLUS (SEPSIS)
250.0000 mL | Freq: Once | INTRAVENOUS | Status: AC
  Administered 2012-07-26: 21:00:00 via INTRAVENOUS

## 2012-07-26 MED ORDER — ONDANSETRON HCL 4 MG/2ML IJ SOLN
4.0000 mg | Freq: Once | INTRAMUSCULAR | Status: AC
  Administered 2012-07-26: 4 mg via INTRAVENOUS
  Filled 2012-07-26: qty 2

## 2012-07-26 MED ORDER — IOHEXOL 350 MG/ML SOLN
100.0000 mL | Freq: Once | INTRAVENOUS | Status: AC | PRN
  Administered 2012-07-26: 100 mL via INTRAVENOUS

## 2012-07-26 NOTE — ED Provider Notes (Signed)
History  This chart was scribed for Shelda Jakes, MD, by Candelaria Stagers, ED Scribe. This patient was seen in room APA16A/APA16A and the patient's care was started at 8:16 PM   CSN: [id number]  Arrival date & time 07/26/12  1959   First MD Initiated Contact with Patient 07/26/12 2008      Chief Complaint  Patient presents with  . Chest Pain     The history is provided by the patient. No language interpreter was used.   HPI Comments: Kathryn Austin is a 37 y.o. female who presents to the Emergency Department complaining of sudden onset of mid sternal squeezing chest pain that started two hours ago and radiates between her shoulder blades.  She is still experiencing 3/10 pain and reports the pain at worse was 10/10.  She denies nausea or vomiting.  Pt experienced SOB and near syncope at onset of chest pain.  Nothing seems to make the sx better or worse.    Past Medical History  Diagnosis Date  . Anxiety   . Hypertension   . GERD (gastroesophageal reflux disease)   . Active smoker 2014    Past Surgical History  Procedure Laterality Date  . Cesarean section      x2  . Tonsillectomy    . Dilation and curettage of uterus    . Tubal ligation    . Cholecystectomy  03/31/2011    Procedure: LAPAROSCOPIC CHOLECYSTECTOMY;  Surgeon: Dalia Heading, MD;  Location: [facility];  Service: General;  Laterality: N/A;  . Abdominal hysterectomy      Family History  Problem Relation Age of Onset  . Anesthesia problems Neg Hx   . Hypotension Neg Hx   . Malignant hyperthermia Neg Hx   . Pseudochol deficiency Neg Hx     History  Substance Use Topics  . Smoking status: Current Every Day Smoker -- 1.00 packs/day for 25 years    Types: Cigarettes  . Smokeless tobacco: Not on file  . Alcohol Use: No    OB History   Grav Para Term Preterm Abortions TAB SAB Ect Mult Living                  Review of Systems  Constitutional: Positive for chills. Negative for fever.  HENT: Positive  for neck pain. Negative for congestion and sore throat.   Eyes: Negative for visual disturbance.  Respiratory: Positive for cough and shortness of breath.   Cardiovascular: Positive for chest pain. Negative for leg swelling.  Gastrointestinal: Negative for nausea, vomiting and abdominal pain.  Genitourinary: Negative for dysuria and hematuria.  Skin: Positive for rash.  Neurological: Positive for dizziness, light-headedness and headaches.  Hematological: Does not bruise/bleed easily.  Psychiatric/Behavioral: Negative for confusion.    Allergies  Review of patient's allergies indicates no known allergies.  Home Medications   Current Outpatient Rx  Name  Route  Sig  Dispense  Refill  . ALPRAZolam (XANAX) 0.5 MG tablet   Oral   Take 0.5 mg by mouth at bedtime as needed for sleep. anxiety         . amphetamine-dextroamphetamine (ADDERALL) 20 MG tablet   Oral   Take 10 mg by mouth daily.         Marland Kitchen buPROPion (WELLBUTRIN) 100 MG tablet   Oral   Take 50 mg by mouth 2 (two) times daily.         . cholecalciferol (VITAMIN D) 1000 UNITS tablet   Oral   Take  1,000 Units by mouth daily.         . Multiple Vitamin (MULITIVITAMIN WITH MINERALS) TABS   Oral   Take 1 tablet by mouth daily.         . nebivolol (BYSTOLIC) 5 MG tablet   Oral   Take 5 mg by mouth daily.           BP 114/73  Pulse 71  Temp(Src) 98.2 F (36.8 C) (Oral)  Resp 20  Ht 5\' 7"  (1.702 m)  Wt 136 lb (61.689 kg)  BMI 21.3 kg/m2  SpO2 99%  Physical Exam  Nursing note and vitals reviewed. Constitutional: She is oriented to person, place, and time. She appears well-developed and well-nourished. No distress.  HENT:  Head: Normocephalic and atraumatic.  Eyes: EOM are normal. No scleral icterus.  Neck: Normal range of motion. Neck supple. No tracheal deviation present.  Cardiovascular: Normal rate and regular rhythm.   No murmur heard. Pulmonary/Chest: Effort normal. No respiratory distress. She  has no wheezes. She has no rales.  Abdominal: Soft. There is no tenderness.  Musculoskeletal: Normal range of motion.  Neurological: She is alert and oriented to person, place, and time. No cranial nerve deficit.  Skin: Skin is warm and dry.  Psychiatric: She has a normal mood and affect. Her behavior is normal.    ED Course  Procedures   DIAGNOSTIC STUDIES: Oxygen Saturation is 100% on room air, normal by my interpretation.    COORDINATION OF CARE:  8:22 PM Discussed course of care with pt which includes chest xray and basic lab work.  Pt understands and agrees.    Labs Reviewed  TROPONIN I  CBC  BASIC METABOLIC PANEL   Dg Chest 2 View  07/26/2012   *RADIOLOGY REPORT*  Clinical Data: Chest pain.  Cough.  Shortness of breath.  CHEST - 2 VIEW  Comparison:  06/23/2012  Findings:  The heart size and mediastinal contours are within normal limits.  Both lungs are clear.  The visualized skeletal structures are unremarkable.  IMPRESSION: No active cardiopulmonary disease.   Original Report Authenticated By: Myles Rosenthal, M.D.   Ct Angio Chest Pe W/cm &/or Wo Cm  07/26/2012   *RADIOLOGY REPORT*  Clinical Data: Chest pain.  Shortness of breath.  CT ANGIOGRAPHY CHEST  Technique:  Multidetector CT imaging of the chest using the standard protocol during bolus administration of intravenous contrast. Multiplanar reconstructed images including MIPs were obtained and reviewed to evaluate the vascular anatomy.  Contrast: OMNIPAQUE IOHEXOL 350 MG/ML SOLN  Comparison: Chest x-ray on 07/26/2012  Findings: No filling defects in the pulmonary arteries to suggest pulmonary emboli.  Dependent ground-glass opacities in both lungs posteriorly, likely dependent atelectasis.  Lungs otherwise clear. No pleural effusions.  Heart is normal size.  Aorta is normal caliber. No mediastinal, hilar, or axillary adenopathy.  Visualized thyroid and chest wall soft tissues unremarkable. Imaging into the upper abdomen shows  no acute findings.  No acute bony abnormality.  IMPRESSION: No evidence of pulmonary embolus.  Dependent atelectasis.  No acute findings.   Original Report Authenticated By: Charlett Nose, M.D.     Date: 07/26/2012  Rate: 72  Rhythm: normal sinus rhythm  QRS Axis: normal  Intervals: normal  ST/T Wave abnormalities: normal  Conduction Disutrbances:none  Narrative Interpretation:   Old EKG Reviewed: unchanged No change in EKG compared to 03/30/2011.   Results for orders placed during the hospital encounter of 07/26/12  TROPONIN I      Result  Value Range   Troponin I <0.30  <0.30 ng/mL  CBC      Result Value Range   WBC 8.8  4.0 - 10.5 K/uL   RBC 4.30  3.87 - 5.11 MIL/uL   Hemoglobin 13.5  12.0 - 15.0 g/dL   HCT 45.4  09.8 - 11.9 %   MCV 90.2  78.0 - 100.0 fL   MCH 31.4  26.0 - 34.0 pg   MCHC 34.8  30.0 - 36.0 g/dL   RDW 14.7  82.9 - 56.2 %   Platelets 318  150 - 400 K/uL  BASIC METABOLIC PANEL      Result Value Range   Sodium 140  135 - 145 mEq/L   Potassium 3.5  3.5 - 5.1 mEq/L   Chloride 103  96 - 112 mEq/L   CO2 27  19 - 32 mEq/L   Glucose, Bld 99  70 - 99 mg/dL   BUN 9  6 - 23 mg/dL   Creatinine, Ser 1.30  0.50 - 1.10 mg/dL   Calcium 8.8  8.4 - 86.5 mg/dL   GFR calc non Af Amer >90  >90 mL/min   GFR calc Af Amer >90  >90 mL/min     1. Chest pain       MDM  Patient's chest pain with shortness of breath more concerning for a pulmonary event or pulmonary embolism or perhaps great vessel problem not a main cardiac problem. Workup negative chest x-ray negative for pneumonia pneumothorax or pulmonary edema. CT angios negative for pulmonary embolus. Troponin was negative EKG without any acute changes. Electrolytes are normal no leukocytosis no anemia. Patient has primary care Dr. to followup with. She does have a skin disorder possible that the symptoms could be related to that. They are in the process of working that up.     I personally performed the services  described in this documentation, which was scribed in my presence. The recorded information has been reviewed and is accurate.         Shelda Jakes, MD 07/26/12 [phone]

## 2012-07-26 NOTE — ED Notes (Signed)
Pt alert & oriented x4, stable gait. Patient given discharge instructions, paperwork & prescription(s). Patient  instructed to stop at the registration desk to finish any additional paperwork. Patient verbalized understanding. Pt left department w/ no further questions. 

## 2012-07-26 NOTE — ED Notes (Signed)
Chest pain, x 2 hours with  Sob, "squeezing" and felt sob.  Had pain in lt leg initially, pt was lying in bed at onset.

## 2012-08-12 ENCOUNTER — Encounter: Payer: Self-pay | Admitting: *Deleted

## 2012-08-13 ENCOUNTER — Ambulatory Visit (INDEPENDENT_AMBULATORY_CARE_PROVIDER_SITE_OTHER): Payer: Commercial Managed Care - PPO | Admitting: Obstetrics & Gynecology

## 2012-08-13 ENCOUNTER — Encounter: Payer: Self-pay | Admitting: Obstetrics & Gynecology

## 2012-08-13 VITALS — BP 130/90 | Ht 68.0 in | Wt 152.0 lb

## 2012-08-13 DIAGNOSIS — M3390 Dermatopolymyositis, unspecified, organ involvement unspecified: Secondary | ICD-10-CM

## 2012-08-13 DIAGNOSIS — IMO0002 Reserved for concepts with insufficient information to code with codable children: Secondary | ICD-10-CM

## 2012-08-13 DIAGNOSIS — N949 Unspecified condition associated with female genital organs and menstrual cycle: Secondary | ICD-10-CM

## 2012-08-13 NOTE — Progress Notes (Signed)
Patient ID: Kathryn Austin, female   DOB: May 05, 1975, 37 y.o.   MRN: [id number] Patient with tentative diagnosis of dermatomyositis  See written notes from last year. Has history of DUB, dyspareunia. Used OCP and megace with no success, as predicted.  Still with symptoms related to intercourse and irregular bleeding  Exam Norma vagina and vulva Cervix is normal +CMT Uterus tender to palption and adnexa is normal sized but tener to plapation  Schedule Ca125 and pelvic sonogram for follow up Considering TAH+/- BSO

## 2012-08-27 ENCOUNTER — Encounter: Payer: Self-pay | Admitting: Obstetrics & Gynecology

## 2012-08-27 ENCOUNTER — Ambulatory Visit (INDEPENDENT_AMBULATORY_CARE_PROVIDER_SITE_OTHER): Payer: Commercial Managed Care - PPO

## 2012-08-27 ENCOUNTER — Ambulatory Visit (INDEPENDENT_AMBULATORY_CARE_PROVIDER_SITE_OTHER): Payer: Commercial Managed Care - PPO | Admitting: Obstetrics & Gynecology

## 2012-08-27 VITALS — BP 110/80 | Wt 135.0 lb

## 2012-08-27 DIAGNOSIS — IMO0002 Reserved for concepts with insufficient information to code with codable children: Secondary | ICD-10-CM | POA: Insufficient documentation

## 2012-08-27 DIAGNOSIS — N926 Irregular menstruation, unspecified: Secondary | ICD-10-CM

## 2012-08-27 DIAGNOSIS — N946 Dysmenorrhea, unspecified: Secondary | ICD-10-CM | POA: Insufficient documentation

## 2012-08-27 DIAGNOSIS — N920 Excessive and frequent menstruation with regular cycle: Secondary | ICD-10-CM

## 2012-08-27 DIAGNOSIS — M3313 Other dermatomyositis without myopathy: Secondary | ICD-10-CM | POA: Insufficient documentation

## 2012-08-27 DIAGNOSIS — M3390 Dermatopolymyositis, unspecified, organ involvement unspecified: Secondary | ICD-10-CM

## 2012-08-27 DIAGNOSIS — N949 Unspecified condition associated with female genital organs and menstrual cycle: Secondary | ICD-10-CM

## 2012-08-27 DIAGNOSIS — N921 Excessive and frequent menstruation with irregular cycle: Secondary | ICD-10-CM | POA: Insufficient documentation

## 2012-08-27 NOTE — Patient Instructions (Signed)
Hysterectomy Information  A hysterectomy is a procedure where your uterus is surgically removed. It will no longer be possible to have menstrual periods or to become pregnant. The tubes and ovaries can be removed (bilateral salpingo-oopherectomy) during this surgery as well.  REASONS FOR A HYSTERECTOMY  Persistent, abnormal bleeding.  Lasting (chronic) pelvic pain or infection.  The lining of the uterus (endometrium) starts growing outside the uterus (endometriosis).  The endometrium starts growing in the muscle of the uterus (adenomyosis).  The uterus falls down into the vagina (pelvic organ prolapse).  Symptomatic uterine fibroids.  Precancerous cells.  Cervical cancer or uterine cancer. TYPES OF HYSTERECTOMIES  Supracervical hysterectomy. This type removes the top part of the uterus, but not the cervix.  Total hysterectomy. This type removes the uterus and cervix.  Radical hysterectomy. This type removes the uterus, cervix, and the fibrous tissue that holds the uterus in place in the pelvis (parametrium). WAYS A HYSTERECTOMY CAN BE PERFORMED  Abdominal hysterectomy. A large surgical cut (incision) is made in the abdomen. The uterus is removed through this incision.  Vaginal hysterectomy. An incision is made in the vagina. The uterus is removed through this incision. There are no abdominal incisions.  Conventional laparoscopic hysterectomy. A thin, lighted tube with a camera (laparoscope) is inserted into 3 or 4 small incisions in the abdomen. The uterus is cut into small pieces. The small pieces are removed through the incisions, or they are removed through the vagina.  Laparoscopic assisted vaginal hysterectomy (LAVH). Three or four small incisions are made in the abdomen. Part of the surgery is performed laparoscopically and part vaginally. The uterus is removed through the vagina.  Robot-assisted laparoscopic hysterectomy. A laparoscope is inserted into 3 or 4 small  incisions in the abdomen. A computer-controlled device is used to give the surgeon a 3D image. This allows for more precise movements of surgical instruments. The uterus is cut into small pieces and removed through the incisions or removed through the vagina. RISKS OF HYSTERECTOMY   Bleeding and risk of blood transfusion. Tell your caregiver if you do not want to receive any blood products.  Blood clots in the legs or lung.  Infection.  Injury to surrounding organs.  Anesthesia problems or side effects.  Conversion to an abdominal hysterectomy. WHAT TO EXPECT AFTER A HYSTERECTOMY  You will be given pain medicine.  You will need to have someone with you for the first 3 to 5 days after you go home.  You will need to follow up with your surgeon in 2 to 4 weeks after surgery to evaluate your progress.  You may have early menopause symptoms like hot flashes, night sweats, and insomnia.  If you had a hysterectomy for a problem that was not a cancer or a condition that could lead to cancer, then you no longer need Pap tests. However, even if you no longer need a Pap test, a regular exam is a good idea to make sure no other problems are starting. Document Released: 08/09/2000 Document Revised: 05/08/2011 Document Reviewed: 09/24/2010 [employer] Patient Information 2014 [employer]  

## 2012-08-27 NOTE — Progress Notes (Signed)
Patient ID: Kathryn Austin, female   DOB: 18-Mar-1975, 37 y.o.   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 Sonogram is ordered to do a preoperative evaluation Please see the sonogram report It is completely normal  Pending same the uterus does not descend at all in fact it feels stuck high As a result the patient would need an abdominal hysterectomy with removal of the cervix the car she has mild dyspareunia  She's been debating with a remove the ovaries was normal C1 25 and sonogram is 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 Today I think she is leaning toward having the ovaries removed as well We talked about surgical menopause and the management problems it can cause We will start her best but it still may be a bit of a bumpy road postoperatively with menopausal symptoms  Her husband is here with her day and they have decided to proceed with a TAH and BSO 9 09/04/2012

## 2012-08-28 ENCOUNTER — Encounter (HOSPITAL_COMMUNITY): Payer: Self-pay

## 2012-08-28 NOTE — Patient Instructions (Addendum)
Kathryn Austin  08/28/2012   Your procedure is scheduled on:  09/04/2012  Report to [facility] at  700 AM.  Call this number if you have problems the morning of surgery: [phone]   Remember:   Do not eat food or drink liquids after midnight.   Take these medicines the morning of surgery with A SIP OF WATER: xanax, adderall, wellbutrin, bystolic, plaquenil   Do not wear jewelry, make-up or nail polish.  Do not wear lotions, powders, or perfumes.   Do not shave 48 hours prior to surgery. Men may shave face and neck.  Do not bring valuables to the hospital.  [facility] is not responsible for any belongings or valuables.  Contacts, dentures or bridgework may not be worn into surgery.  Leave suitcase in the car. After surgery it may be brought to your room.  For patients admitted to the hospital, checkout time is 11:00 AM the day of discharge.   Patients discharged the day of surgery will not be allowed to drive home.  Name and phone number of your driver: family  Special Instructions: Shower using CHG 2 nights before surgery and the night before surgery.  If you shower the day of surgery use CHG.  Use special wash - you have one bottle of CHG for all showers.  You should use approximately 1/3 of the bottle for each shower.   Please read over the following fact sheets that you were given: Pain Booklet, Coughing and Deep Breathing, Blood Transfusion Information, MRSA Information, Surgical Site Infection Prevention, Anesthesia Post-op Instructions and Care and Recovery After Surgery Bilateral Salpingo-Oophorectomy Removal of both fallopian tubes and ovaries is called a Bilateral Salpingo-oophorectomy (BSO). The fallopian tubes transport the egg from the ovary to the womb (uterus). The fallopian tube is also where the sperm and egg meet and become fertilized and move down into the uterus. Usually when a BSO is done, the uterus was previously removed. Removing both tubes and ovaries  will:  Put you into the menopause. You will no longer have menstrual periods.  May cause you to have symptoms of menopause (hot flashes, night sweats, mood changes).  Not affect your sex drive or physical relationship.  Cause you to not be able to become pregnant (sterile). LET YOUR CAREGIVER KNOW ABOUT:  Allergies to food or medication.  Medications taken including herbs, eye drops, over-the-counter medications, and creams.  Use of steroids (by mouth or creams).  Previous problems with anesthetics or numbing medication.  Possibility of pregnancy, if this applies.  Your smoking habits  History of blood clots (thrombophlebitis).  History of bleeding or blood problems.  Previous surgeries.  Other health problems. RISKS AND COMPLICATIONS All surgery is associated with risks. Some of these risks are:  Injury to surrounding organs.  Bleeding.  Infection.  Blood clots in the legs or lungs.  Problems with the anesthesia.  The surgery does not help the problem.  Death. BEFORE THE PROCEDURE  Do not take aspirin or blood thinners because it can make you bleed.  Do not eat or drink anything at least 8 hours before the surgery.  Let your caregiver know if you develop a cold or an infection.  If you are being admitted the day of surgery, arrive at least 1 hour before the surgery to read and sign the necessary forms and consents.  Arrange for help when you go home from the hospital.  If you smoke, do not smoke  for at least 2 weeks before the surgery. PROCEDURE  You will change into a hospital gown. Then, you will be given an IV (intravenous) and a medication to relax you. You will be put to sleep with an anesthetic. Any hair on your lower belly (abdomen) will be removed, and a catheter will be placed in your bladder. The fallopian tubes and ovaries will be removed either through 2 very small cuts (incisions) or through large incision in the lower abdomen. AFTER THE  PROCEDURE  You will be taken to the recovery room for 1 to 3 hours until your blood pressure, pulse, and temperature are stable and you are waking up.  If you had a laparoscopy, you may be discharged in several hours.  If you had a laparoscopy, you may have shoulder pain for a day or two from air left in the abdomen. The air can irritate the nerve that goes from the diaphragm to the shoulder.  You will be given pain medication as is necessary.  The intravenous and catheter will be removed.  Have someone available to take you home from the hospital. HOME CARE INSTRUCTIONS   Only take over-the-counter or prescription medicines for pain, discomfort, or fever as directed by your caregiver.  Do not take aspirin. It can cause bleeding.  Do not drive when taking pain medication.  Follow your caregiver's advice regarding diet, exercise, lifting, driving, and general activities.  You may resume your usual diet as directed and allowed.  Get plenty of rest and sleep.  Do not douche, use tampons, or have sexual intercourse until your caregiver says it is okay.  Change your bandages (dressings) as directed.  Take your temperature twice a day and write it down.  Your caregiver may recommend showers instead of baths for a few weeks.  Do not drink alcohol until your caregiver says it is okay.  If you develop constipation, you may take a mild laxative with your caregiver's permission. Bran foods and drinking fluids helps with constipation problems.  Try to have someone home with you for a week or two to help with the household activities.  Make sure you and your family understands everything about your operation and recovery.  Do not sign any legal documents until you feel normal again.  Keep all your follow-up appointments. SEEK MEDICAL CARE IF:   There is swelling, redness, or increasing pain in the wound area.  Pus is coming from the wound.  You notice a bad smell from the wound  or surgical dressing.  You have pain, redness, or swelling from the intravenous site.  The wound is breaking open (the edges are not staying together).  You feel dizzy or feel like fainting.  You develop pain or bleeding when you urinate.  You develop diarrhea.  You develop nausea and vomiting.  You develop abnormal vaginal discharge.  You develop a rash.  You have any type of abnormal reaction or develop an allergy to your medication.  You need stronger pain medication for your pain. SEEK IMMEDIATE MEDICAL CARE IF:   You develop an unexplained temperature above 100 F (37.8 C).  You develop abdominal pain.  You develop chest pain.  You develop shortness of breath.  You pass out.  You develop pain, swelling, or redness of your leg.  You develop heavy vaginal bleeding with or without blood clots. Document Released: 02/13/2005 Document Revised: 05/08/2011 Document Reviewed: 07/10/2008 [facility] Patient Information 2014 [place], [place]. Hysteroscopy Hysteroscopy is a procedure used for looking inside  the womb (uterus). It may be done for many different reasons, including:  To evaluate abnormal bleeding, fibroid (benign, noncancerous) tumors, polyps, scar tissue (adhesions), and possibly cancer of the uterus.  To look for lumps (tumors) and other uterine growths.  To look for causes of why a woman cannot get pregnant (infertility), causes of recurrent loss of pregnancy (miscarriages), or a lost intrauterine device (IUD).  To perform a sterilization by blocking the fallopian tubes from inside the uterus. A hysteroscopy should be done right after a menstrual period to be sure you are not pregnant. LET YOUR CAREGIVER KNOW ABOUT:   Allergies.  Medicines taken, including herbs, eyedrops, over-the-counter medicines, and creams.  Use of steroids (by mouth or creams).  Previous problems with anesthetics or numbing medicines.  History of bleeding or blood  problems.  History of blood clots.  Possibility of pregnancy, if this applies.  Previous surgery.  Other health problems. RISKS AND COMPLICATIONS   Putting a hole in the uterus.  Excessive bleeding.  Infection.  Damage to the cervix.  Injury to other organs.  Allergic reaction to medicines.  Too much fluid used in the uterus for the procedure. BEFORE THE PROCEDURE   Do not take aspirin or blood thinners for a week before the procedure, or as directed. It can cause bleeding.  Arrive at least 60 minutes before the procedure or as directed to read and sign the necessary forms.  Arrange for someone to take you home after the procedure.  If you smoke, do not smoke for 2 weeks before the procedure. PROCEDURE   Your caregiver may give you medicine to relax you. He or she may also give you a medicine that numbs the area around the cervix (local anesthetic) or a medicine that makes you sleep (general anesthesia).  Sometimes, a medicine is placed in the cervix the day before the procedure. This medicine makes the cervix have a larger opening (dilate). This makes it easier for the instrument to be inserted into the uterus.  A small instrument (hysteroscope) is inserted through the vagina into the uterus. This instrument is similar to a pencil-sized telescope with a light.  During the procedure, air or a liquid is put into the uterus, which allows the surgeon to see better.  Sometimes, tissue is gently scraped from inside the uterus. These tissue samples are sent to a specialist who looks at tissue samples (pathologist). The pathologist will give a report to your caregiver. This will help your caregiver decide if further treatment is necessary. The report will also help your caregiver decide on the best treatment if the test comes back abnormal. AFTER THE PROCEDURE   If you had a general anesthetic, you may be groggy for a couple hours after the procedure.  If you had a local  anesthetic, you will be advised to rest at the surgical center or caregiver's office until you are stable and feel ready to go home.  You may have some cramping for a couple days.  You may have bleeding, which varies from light spotting for a few days to menstrual-like bleeding for up to 3 to 7 days. This is normal.  Have someone take you home. FINDING OUT THE RESULTS OF YOUR TEST Not all test results are available during your visit. If your test results are not back during the visit, make an appointment with your caregiver to find out the results. Do not assume everything is normal if you have not heard from your caregiver or the  medical facility. It is important for you to follow up on all of your test results. HOME CARE INSTRUCTIONS   Do not drive for 24 hours or as instructed.  Only take over-the-counter or prescription medicines for pain, discomfort, or fever as directed by your caregiver.  Do not take aspirin. It can cause or aggravate bleeding.  Do not drive or drink alcohol while taking pain medicine.  You may resume your usual diet.  Do not use tampons, douche, or have sexual intercourse for 2 weeks, or as advised by your caregiver.  Rest and sleep for the first 24 to 48 hours.  Take your temperature twice a day for 4 to 5 days. Write it down. Give these temperatures to your caregiver if they are abnormal (above 98.6 F or 37.0 C).  Take medicines your caregiver has ordered as directed.  Follow your caregiver's advice regarding diet, exercise, lifting, driving, and general activities.  Take showers instead of baths for 2 weeks, or as recommended by your caregiver.  If you develop constipation:  Take a mild laxative with the advice of your caregiver.  Eat bran foods.  Drink enough water and fluids to keep your urine clear or pale yellow.  Try to have someone with you or available to you for the first 24 to 48 hours, especially if you had a general anesthetic.  Make  sure you and your family understand everything about your operation and recovery.  Follow your caregiver's advice regarding follow-up appointments and Pap smears. SEEK MEDICAL CARE IF:   You feel dizzy or lightheaded.  You feel sick to your stomach (nauseous).  You develop abnormal vaginal discharge.  You develop a rash.  You have an abnormal reaction or allergy to your medicine.  You need stronger pain medicine. SEEK IMMEDIATE MEDICAL CARE IF:   Bleeding is heavier than a normal menstrual period or you have blood clots.  You have an oral temperature above 102 F (38.9 C), not controlled by medicine.  You have increasing cramps or pains not relieved with medicine.  You develop belly (abdominal) pain that does not seem to be related to the same area of earlier cramping and pain.  You pass out.  You develop pain in the tops of your shoulders (shoulder strap areas).  You develop shortness of breath. MAKE SURE YOU:   Understand these instructions.  Will watch your condition.  Will get help right away if you are not doing well or get worse. Document Released: 05/22/2000 Document Revised: 05/08/2011 Document Reviewed: 09/14/2008 [facility] Patient Information 2014 [place], [place]. PATIENT INSTRUCTIONS POST-ANESTHESIA  IMMEDIATELY FOLLOWING SURGERY:  Do not drive or operate machinery for the first twenty four hours after surgery.  Do not make any important decisions for twenty four hours after surgery or while taking narcotic pain medications or sedatives.  If you develop intractable nausea and vomiting or a severe headache please notify your doctor immediately.  FOLLOW-UP:  Please make an appointment with your surgeon as instructed. You do not need to follow up with anesthesia unless specifically instructed to do so.  WOUND CARE INSTRUCTIONS (if applicable):  Keep a dry clean dressing on the anesthesia/puncture wound site if there is drainage.  Once the wound has quit draining  you may leave it open to air.  Generally you should leave the bandage intact for twenty four hours unless there is drainage.  If the epidural site drains for more than 36-48 hours please call the anesthesia department.  QUESTIONS?:  Please feel  free to call your physician or the hospital operator if you have any questions, and they will be happy to assist you.

## 2012-08-29 ENCOUNTER — Encounter (HOSPITAL_COMMUNITY)
Admission: RE | Admit: 2012-08-29 | Discharge: 2012-08-29 | Disposition: A | Discharge status: Home / Self Care | Payer: Commercial Managed Care - PPO | Source: Ambulatory Visit | Attending: Obstetrics & Gynecology | Admitting: Obstetrics & Gynecology

## 2012-08-29 ENCOUNTER — Encounter (HOSPITAL_COMMUNITY): Payer: Self-pay

## 2012-08-29 HISTORY — DX: Myoneural disorder, unspecified: G70.9

## 2012-08-29 LAB — COMPREHENSIVE METABOLIC PANEL
AST: 11 U/L (ref 0–37)
BUN: 7 mg/dL (ref 6–23)
CO2: 26 mEq/L (ref 19–32)
Calcium: 10 mg/dL (ref 8.4–10.5)
Creatinine, Ser: 0.73 mg/dL (ref 0.50–1.10)
GFR calc Af Amer: >90 mL/min (ref >90)
GFR calc non Af Amer: >90 mL/min (ref >90)
Total Bilirubin: 0.1 mg/dL — ABNORMAL LOW (ref 0.3–1.2)

## 2012-08-29 LAB — URINALYSIS, ROUTINE W REFLEX MICROSCOPIC
Bilirubin Urine: NEGATIVE
Glucose, UA: NEGATIVE mg/dL
Specific Gravity, Urine: 1.01 (ref 1.005–1.030)
Urobilinogen, UA: 0.2 mg/dL (ref 0.0–1.0)
pH: 5.5 (ref 5.0–8.0)

## 2012-08-29 LAB — URINE MICROSCOPIC-ADD ON

## 2012-08-29 LAB — CBC
HCT: 41.3 % (ref 36.0–46.0)
MCH: 31.9 pg (ref 26.0–34.0)
MCV: 91.4 fL (ref 78.0–100.0)
Platelets: 281 10*3/uL (ref 150–400)
RBC: 4.52 MIL/uL (ref 3.87–5.11)

## 2012-08-29 LAB — HCG, QUANTITATIVE, PREGNANCY: hCG, Beta Chain, Quant, S: <1 m[IU]/mL (ref <5)

## 2012-09-04 ENCOUNTER — Inpatient Hospital Stay (HOSPITAL_COMMUNITY): Payer: Commercial Managed Care - PPO | Admitting: Anesthesiology

## 2012-09-04 ENCOUNTER — Encounter (HOSPITAL_COMMUNITY)
Admission: RE | Disposition: A | Discharge status: Home / Self Care | Payer: Self-pay | Source: Ambulatory Visit | Attending: Obstetrics & Gynecology

## 2012-09-04 ENCOUNTER — Observation Stay (HOSPITAL_COMMUNITY)
Admission: RE | Admit: 2012-09-04 | Discharge: 2012-09-05 | Disposition: A | Discharge status: Home / Self Care | Payer: Commercial Managed Care - PPO | Source: Ambulatory Visit | Attending: Obstetrics & Gynecology | Admitting: Obstetrics & Gynecology

## 2012-09-04 ENCOUNTER — Encounter (HOSPITAL_COMMUNITY): Payer: Self-pay | Admitting: Anesthesiology

## 2012-09-04 ENCOUNTER — Encounter (HOSPITAL_COMMUNITY): Payer: Self-pay | Admitting: *Deleted

## 2012-09-04 DIAGNOSIS — N946 Dysmenorrhea, unspecified: Secondary | ICD-10-CM | POA: Insufficient documentation

## 2012-09-04 DIAGNOSIS — N8300 Follicular cyst of ovary, unspecified side: Secondary | ICD-10-CM

## 2012-09-04 DIAGNOSIS — Z79899 Other long term (current) drug therapy: Secondary | ICD-10-CM | POA: Insufficient documentation

## 2012-09-04 DIAGNOSIS — Z01812 Encounter for preprocedural laboratory examination: Secondary | ICD-10-CM | POA: Insufficient documentation

## 2012-09-04 DIAGNOSIS — Z90710 Acquired absence of both cervix and uterus: Secondary | ICD-10-CM

## 2012-09-04 DIAGNOSIS — N920 Excessive and frequent menstruation with regular cycle: Secondary | ICD-10-CM

## 2012-09-04 DIAGNOSIS — N924 Excessive bleeding in the premenopausal period: Secondary | ICD-10-CM | POA: Insufficient documentation

## 2012-09-04 DIAGNOSIS — IMO0002 Reserved for concepts with insufficient information to code with codable children: Secondary | ICD-10-CM

## 2012-09-04 DIAGNOSIS — G8929 Other chronic pain: Secondary | ICD-10-CM | POA: Insufficient documentation

## 2012-09-04 DIAGNOSIS — M3390 Dermatopolymyositis, unspecified, organ involvement unspecified: Secondary | ICD-10-CM | POA: Insufficient documentation

## 2012-09-04 DIAGNOSIS — I10 Essential (primary) hypertension: Secondary | ICD-10-CM | POA: Insufficient documentation

## 2012-09-04 DIAGNOSIS — N949 Unspecified condition associated with female genital organs and menstrual cycle: Secondary | ICD-10-CM

## 2012-09-04 DIAGNOSIS — M3313 Other dermatomyositis without myopathy: Secondary | ICD-10-CM | POA: Insufficient documentation

## 2012-09-04 DIAGNOSIS — Z01810 Encounter for preprocedural cardiovascular examination: Secondary | ICD-10-CM | POA: Insufficient documentation

## 2012-09-04 DIAGNOSIS — N838 Other noninflammatory disorders of ovary, fallopian tube and broad ligament: Secondary | ICD-10-CM

## 2012-09-04 HISTORY — PX: ABDOMINAL HYSTERECTOMY: SHX81

## 2012-09-04 HISTORY — PX: SALPINGOOPHORECTOMY: SHX82

## 2012-09-04 SURGERY — HYSTERECTOMY, ABDOMINAL
Anesthesia: General | Site: Abdomen | Wound class: Clean Contaminated

## 2012-09-04 MED ORDER — SUCCINYLCHOLINE CHLORIDE 20 MG/ML IJ SOLN
INTRAMUSCULAR | Status: DC | PRN
  Administered 2012-09-04: 140 mg via INTRAVENOUS

## 2012-09-04 MED ORDER — LIDOCAINE HCL (CARDIAC) 20 MG/ML IV SOLN
INTRAVENOUS | Status: DC | PRN
  Administered 2012-09-04: 50 mg via INTRAVENOUS

## 2012-09-04 MED ORDER — KETOROLAC TROMETHAMINE 30 MG/ML IJ SOLN
30.0000 mg | Freq: Once | INTRAMUSCULAR | Status: AC
Start: 2012-09-04 — End: 2012-09-04
  Administered 2012-09-04: 30 mg via INTRAVENOUS

## 2012-09-04 MED ORDER — SODIUM CHLORIDE 0.9 % IJ SOLN
INTRAMUSCULAR | Status: DC | PRN
  Administered 2012-09-04: 10:00:00

## 2012-09-04 MED ORDER — KETOROLAC TROMETHAMINE 30 MG/ML IJ SOLN
INTRAMUSCULAR | Status: AC
  Filled 2012-09-04: qty 1

## 2012-09-04 MED ORDER — FENTANYL CITRATE 0.05 MG/ML IJ SOLN
25.0000 ug | INTRAMUSCULAR | Status: DC | PRN
  Administered 2012-09-04 (×4): 50 ug via INTRAVENOUS

## 2012-09-04 MED ORDER — ROCURONIUM BROMIDE 100 MG/10ML IV SOLN
INTRAVENOUS | Status: DC | PRN
  Administered 2012-09-04: 35 mg via INTRAVENOUS
  Administered 2012-09-04: 10 mg via INTRAVENOUS
  Administered 2012-09-04: 5 mg via INTRAVENOUS

## 2012-09-04 MED ORDER — DEXAMETHASONE SODIUM PHOSPHATE 4 MG/ML IJ SOLN
INTRAMUSCULAR | Status: AC
  Filled 2012-09-04: qty 1

## 2012-09-04 MED ORDER — NEBIVOLOL HCL 2.5 MG PO TABS
5.0000 mg | ORAL_TABLET | Freq: Every day | ORAL | Status: DC
  Administered 2012-09-05: 5 mg via ORAL
  Filled 2012-09-04 (×3): qty 2

## 2012-09-04 MED ORDER — ONDANSETRON HCL 4 MG/2ML IJ SOLN: 4.0000 mg | Freq: Once | INTRAMUSCULAR | Status: DC | PRN

## 2012-09-04 MED ORDER — LACTATED RINGERS IV SOLN
INTRAVENOUS | Status: DC
  Administered 2012-09-04 (×2): via INTRAVENOUS

## 2012-09-04 MED ORDER — MIDAZOLAM HCL 2 MG/2ML IJ SOLN
1.0000 mg | INTRAMUSCULAR | Status: DC | PRN
  Administered 2012-09-04: 2 mg via INTRAVENOUS

## 2012-09-04 MED ORDER — ZOLPIDEM TARTRATE 5 MG PO TABS: 10.0000 mg | ORAL_TABLET | Freq: Every evening | ORAL | Status: DC | PRN

## 2012-09-04 MED ORDER — DEXAMETHASONE SODIUM PHOSPHATE 4 MG/ML IJ SOLN
4.0000 mg | Freq: Once | INTRAMUSCULAR | Status: AC
  Administered 2012-09-04: 4 mg via INTRAVENOUS

## 2012-09-04 MED ORDER — PROPOFOL 10 MG/ML IV BOLUS
INTRAVENOUS | Status: DC | PRN
  Administered 2012-09-04: 150 mg via INTRAVENOUS

## 2012-09-04 MED ORDER — CEFAZOLIN SODIUM-DEXTROSE 2-3 GM-% IV SOLR
INTRAVENOUS | Status: AC
  Filled 2012-09-04: qty 50

## 2012-09-04 MED ORDER — CEFAZOLIN SODIUM-DEXTROSE 2-3 GM-% IV SOLR
2.0000 g | INTRAVENOUS | Status: AC
  Administered 2012-09-04: 2 g via INTRAVENOUS

## 2012-09-04 MED ORDER — NEOSTIGMINE METHYLSULFATE 1 MG/ML IJ SOLN
INTRAMUSCULAR | Status: DC | PRN
  Administered 2012-09-04: 4 mg via INTRAVENOUS

## 2012-09-04 MED ORDER — FENTANYL CITRATE 0.05 MG/ML IJ SOLN
INTRAMUSCULAR | Status: AC
  Filled 2012-09-04: qty 2

## 2012-09-04 MED ORDER — ONDANSETRON HCL 4 MG PO TABS
8.0000 mg | ORAL_TABLET | Freq: Four times a day (QID) | ORAL | Status: DC | PRN
  Administered 2012-09-04 (×2): 8 mg via ORAL
  Filled 2012-09-04 (×2): qty 2

## 2012-09-04 MED ORDER — DOCUSATE SODIUM 100 MG PO CAPS
100.0000 mg | ORAL_CAPSULE | Freq: Two times a day (BID) | ORAL | Status: DC
  Administered 2012-09-04 – 2012-09-05 (×2): 100 mg via ORAL
  Filled 2012-09-04 (×2): qty 1

## 2012-09-04 MED ORDER — KCL IN DEXTROSE-NACL 40-5-0.45 MEQ/L-%-% IV SOLN
INTRAVENOUS | Status: DC
  Administered 2012-09-04 – 2012-09-05 (×3): via INTRAVENOUS

## 2012-09-04 MED ORDER — ONDANSETRON HCL 4 MG/2ML IJ SOLN
INTRAMUSCULAR | Status: AC
  Filled 2012-09-04: qty 2

## 2012-09-04 MED ORDER — MENTHOL 3 MG MT LOZG: 1.0000 | LOZENGE | OROMUCOSAL | Status: DC | PRN

## 2012-09-04 MED ORDER — ONDANSETRON HCL 4 MG/2ML IJ SOLN
4.0000 mg | Freq: Once | INTRAMUSCULAR | Status: AC
  Administered 2012-09-04: 4 mg via INTRAVENOUS

## 2012-09-04 MED ORDER — BUPROPION HCL ER (XL) 300 MG PO TB24
300.0000 mg | ORAL_TABLET | Freq: Every day | ORAL | Status: DC
  Administered 2012-09-05: 300 mg via ORAL
  Filled 2012-09-04 (×3): qty 1

## 2012-09-04 MED ORDER — ALUM & MAG HYDROXIDE-SIMETH 200-200-20 MG/5ML PO SUSP: 30.0000 mL | ORAL | Status: DC | PRN

## 2012-09-04 MED ORDER — SODIUM CHLORIDE 0.9 % IR SOLN
Status: DC | PRN
  Administered 2012-09-04 (×4): 1000 mL

## 2012-09-04 MED ORDER — LIDOCAINE HCL (PF) 1 % IJ SOLN
INTRAMUSCULAR | Status: AC
  Filled 2012-09-04: qty 5

## 2012-09-04 MED ORDER — MIDAZOLAM HCL 2 MG/2ML IJ SOLN
INTRAMUSCULAR | Status: AC
  Filled 2012-09-04: qty 2

## 2012-09-04 MED ORDER — HYDROMORPHONE HCL PF 1 MG/ML IJ SOLN
1.0000 mg | INTRAMUSCULAR | Status: DC | PRN
  Administered 2012-09-04 (×3): 1 mg via INTRAVENOUS
  Administered 2012-09-04: 2 mg via INTRAVENOUS
  Administered 2012-09-05 (×2): 1 mg via INTRAVENOUS
  Administered 2012-09-05: 2 mg via INTRAVENOUS
  Filled 2012-09-04 (×2): qty 2
  Filled 2012-09-04 (×5): qty 1

## 2012-09-04 MED ORDER — GLYCOPYRROLATE 0.2 MG/ML IJ SOLN
INTRAMUSCULAR | Status: DC | PRN
  Administered 2012-09-04: .5 mg via INTRAVENOUS

## 2012-09-04 MED ORDER — HYDROXYCHLOROQUINE SULFATE 200 MG PO TABS
200.0000 mg | ORAL_TABLET | Freq: Two times a day (BID) | ORAL | Status: DC
  Administered 2012-09-05: 200 mg via ORAL
  Filled 2012-09-04 (×5): qty 1

## 2012-09-04 MED ORDER — AMPHETAMINE-DEXTROAMPHETAMINE 10 MG PO TABS
10.0000 mg | ORAL_TABLET | Freq: Every day | ORAL | Status: DC
  Administered 2012-09-05: 10 mg via ORAL
  Filled 2012-09-04: qty 1

## 2012-09-04 MED ORDER — OXYCODONE-ACETAMINOPHEN 5-325 MG PO TABS
1.0000 | ORAL_TABLET | ORAL | Status: DC | PRN
  Administered 2012-09-04 (×2): 2 via ORAL
  Administered 2012-09-04: 1 via ORAL
  Administered 2012-09-05 (×2): 2 via ORAL
  Filled 2012-09-04: qty 2
  Filled 2012-09-04: qty 1
  Filled 2012-09-04 (×3): qty 2

## 2012-09-04 MED ORDER — BECLOMETHASONE DIPROPIONATE 80 MCG/ACT NA AERS
1.0000 | INHALATION_SPRAY | Freq: Every day | NASAL | Status: DC | PRN
  Filled 2012-09-04: qty 1

## 2012-09-04 MED ORDER — ALPRAZOLAM 0.5 MG PO TABS
0.5000 mg | ORAL_TABLET | Freq: Every evening | ORAL | Status: DC | PRN
  Administered 2012-09-04: 0.5 mg via ORAL
  Filled 2012-09-04: qty 1

## 2012-09-04 MED ORDER — ROCURONIUM BROMIDE 50 MG/5ML IV SOLN
INTRAVENOUS | Status: AC
  Filled 2012-09-04: qty 1

## 2012-09-04 MED ORDER — ONDANSETRON 8 MG/NS 50 ML IVPB
8.0000 mg | Freq: Four times a day (QID) | INTRAVENOUS | Status: DC | PRN
  Filled 2012-09-04: qty 8

## 2012-09-04 MED ORDER — PROPOFOL 10 MG/ML IV EMUL
INTRAVENOUS | Status: AC
  Filled 2012-09-04: qty 20

## 2012-09-04 MED ORDER — SUCCINYLCHOLINE CHLORIDE 20 MG/ML IJ SOLN
INTRAMUSCULAR | Status: AC
  Filled 2012-09-04: qty 1

## 2012-09-04 MED ORDER — BUPIVACAINE LIPOSOME 1.3 % IJ SUSP
20.0000 mL | Freq: Once | INTRAMUSCULAR | Status: DC
  Filled 2012-09-04: qty 20

## 2012-09-04 MED ORDER — FENTANYL CITRATE 0.05 MG/ML IJ SOLN
INTRAMUSCULAR | Status: DC | PRN
  Administered 2012-09-04: 100 ug via INTRAVENOUS
  Administered 2012-09-04 (×3): 50 ug via INTRAVENOUS

## 2012-09-04 SURGICAL SUPPLY — 45 items
ADH SKN CLS APL DERMABOND .7 (GAUZE/BANDAGES/DRESSINGS) ×4
BAG HAMPER (MISCELLANEOUS) ×3 IMPLANT
CELLS DAT CNTRL 66122 CELL SVR (MISCELLANEOUS) ×2 IMPLANT
CLOTH BEACON ORANGE TIMEOUT ST (SAFETY) ×3 IMPLANT
COVER LIGHT HANDLE STERIS (MISCELLANEOUS) ×6 IMPLANT
DERMABOND ADVANCED (GAUZE/BANDAGES/DRESSINGS) ×2
DERMABOND ADVANCED .7 DNX12 (GAUZE/BANDAGES/DRESSINGS) IMPLANT
DRAPE WARM FLUID 44X44 (DRAPE) ×3 IMPLANT
ELECT REM PT RETURN 9FT ADLT (ELECTROSURGICAL) ×3
ELECTRODE REM PT RTRN 9FT ADLT (ELECTROSURGICAL) ×2 IMPLANT
FORMALIN 10 PREFIL 480ML (MISCELLANEOUS) ×3 IMPLANT
GLOVE BIOGEL PI IND STRL 7.0 (GLOVE) IMPLANT
GLOVE BIOGEL PI IND STRL 8 (GLOVE) ×2 IMPLANT
GLOVE BIOGEL PI INDICATOR 7.0 (GLOVE) ×3
GLOVE BIOGEL PI INDICATOR 8 (GLOVE) ×1
GLOVE ECLIPSE 6.5 STRL STRAW (GLOVE) ×2 IMPLANT
GLOVE ECLIPSE 8.0 STRL XLNG CF (GLOVE) ×3 IMPLANT
GLOVE EXAM NITRILE LRG STRL (GLOVE) ×2 IMPLANT
GOWN SRG XL XLNG 56XLVL 4 (GOWN DISPOSABLE) ×2 IMPLANT
GOWN STRL NON-REIN XL XLG LVL4 (GOWN DISPOSABLE) ×3
GOWN STRL REIN XL XLG (GOWN DISPOSABLE) ×6 IMPLANT
INST SET MAJOR GENERAL (KITS) ×3 IMPLANT
KIT ROOM TURNOVER APOR (KITS) ×3 IMPLANT
MANIFOLD NEPTUNE II (INSTRUMENTS) ×3 IMPLANT
NDL HYPO 18GX1.5 BLUNT FILL (NEEDLE) IMPLANT
NDL HYPO 21X1.5 SAFETY (NEEDLE) IMPLANT
NEEDLE HYPO 18GX1.5 BLUNT FILL (NEEDLE) ×3 IMPLANT
NEEDLE HYPO 21X1.5 SAFETY (NEEDLE) ×3 IMPLANT
NS IRRIG 1000ML POUR BTL (IV SOLUTION) ×8 IMPLANT
PACK ABDOMINAL MAJOR (CUSTOM PROCEDURE TRAY) ×3 IMPLANT
PAD ARMBOARD 7.5X6 YLW CONV (MISCELLANEOUS) ×3 IMPLANT
RETRACTOR WND ALEXIS 18 MED (MISCELLANEOUS) IMPLANT
RTRCTR WOUND ALEXIS 18CM MED (MISCELLANEOUS) ×3
SET BASIN LINEN APH (SET/KITS/TRAYS/PACK) ×3 IMPLANT
SPONGE LAP 18X18 X RAY DECT (DISPOSABLE) ×1 IMPLANT
STAPLER VISISTAT 35W (STAPLE) ×3 IMPLANT
SUT CHROMIC 0 CT 1 (SUTURE) ×3 IMPLANT
SUT MON AB 3-0 SH 27 (SUTURE) ×1 IMPLANT
SUT VIC AB 0 CT1 27 (SUTURE) ×9
SUT VIC AB 0 CT1 27XCR 8 STRN (SUTURE) ×4 IMPLANT
SUT VIC AB 0 CTX 36 (SUTURE) ×3
SUT VIC AB 0 CTX36XBRD ANTBCTR (SUTURE) ×2 IMPLANT
SUT VICRYL 3 0 (SUTURE) ×1 IMPLANT
SYR 20CC LL (SYRINGE) ×1 IMPLANT
TRAY FOLEY CATH 14FR (SET/KITS/TRAYS/PACK) ×1 IMPLANT

## 2012-09-04 NOTE — Anesthesia Postprocedure Evaluation (Addendum)
  Anesthesia Post-op Note  Patient: Kathryn Austin  Procedure(s) Performed: Procedure(s): HYSTERECTOMY ABDOMINAL (N/A) BILATERAL SALPINGO OOPHORECTOMY (Bilateral)  Patient Location: PACU  Anesthesia Type:General  Level of Consciousness: awake and sedated  Airway and Oxygen Therapy: Patient Spontanous Breathing and Patient connected to face mask oxygen  Post-op Pain: none  Post-op Assessment: Post-op Vital signs reviewed, Patient's Cardiovascular Status Stable and Respiratory Function Stable  Post-op Vital Signs: Reviewed and stable  Complications: No apparent anesthesia complications 09/05/12  Patient up and walking.  VSS.  No apparent anesthesia complications, yet patient did feel pain took awhile to get under control.

## 2012-09-04 NOTE — Anesthesia Preprocedure Evaluation (Signed)
Anesthesia Evaluation  Patient identified by MRN, date of birth, ID band Patient awake    Reviewed: Allergy & Precautions, H&P , NPO status , Patient's Chart, lab work & pertinent test results, reviewed documented beta blocker date and time   Airway Mallampati: I TM Distance: >3 FB Neck ROM: Full    Dental no notable dental hx.    Pulmonary neg pulmonary ROS,    Pulmonary exam normal       Cardiovascular hypertension, Pt. on medications and Pt. on home beta blockers Rhythm:Regular Rate:Normal     Neuro/Psych Anxiety negative neurological ROS     GI/Hepatic Neg liver ROS, GERD-  ,  Endo/Other  negative endocrine ROS  Renal/GU negative Renal ROS  negative genitourinary   Musculoskeletal negative musculoskeletal ROS (+)   Abdominal Normal abdominal exam  (+)   Peds  Hematology negative hematology ROS (+)   Anesthesia Other Findings   Reproductive/Obstetrics negative OB ROS                           Anesthesia Physical Anesthesia Plan  ASA: II  Anesthesia Plan: General   Post-op Pain Management:    Induction: Intravenous, Rapid sequence and Cricoid pressure planned  Airway Management Planned: Oral ETT  Additional Equipment:   Intra-op Plan:   Post-operative Plan: Extubation in OR  Informed Consent: I have reviewed the patients History and Physical, chart, labs and discussed the procedure including the risks, benefits and alternatives for the proposed anesthesia with the patient or authorized representative who has indicated his/her understanding and acceptance.   Dental advisory given  Plan Discussed with: CRNA  Anesthesia Plan Comments:         Anesthesia Quick Evaluation

## 2012-09-04 NOTE — Op Note (Signed)
Preoperative diagnosis:  1.  Dyspareunia                                          2.  Dysmenorrhea                                         3.  Menorrhagia                                         4.  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  Sonogram is ordered to do a preoperative evaluation  Please see the sonogram report  It is completely normal  Pending same the uterus does not descend at all in fact it feels stuck high  As a result the patient would need an abdominal hysterectomy with removal of the cervix the car she has mild dyspareunia  She's been debating with a remove the ovaries was normal C1 25 and sonogram is  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  Today I think she is leaning toward having the ovaries removed as well  We talked about surgical menopause and the management problems it can cause  We will start her best but it still may be a bit of a bumpy road postoperatively with menopausal symptoms  Her husband is here with her day and they have decided to proceed with a TAH and BSO 9 09/04/2012        Intraoperative findings: Bladder densely adherent very high on uterus, small atretic right ovary, left ovary normal, normal uterus  Description of operation:  Patient was taken to the operating room and placed in the supine position where she underwent general endotracheal anesthesia.  She was then prepped and draped in the usual sterile fashion and a Foley catheter was placed  for continuous bladder drainage.  A Pfannenstiel skin incision was made and carried down sharply to the rectus fascia which was scored in the midline and extended laterally.  The fascia was taken off the muscles superiorly and inferiorly without difficulty.  The muscles were divided.  The peritoneal cavity was entered.  A medium Alexis self-retaining retractor was placed.  The upper abdomen was packed away. Both uterine cornu were grasped with Coker clamps.  The left round ligament was suture ligated and coagulated with the electrocautery unit.  The left vesicouterine serosal flap was created.  An avascular window in in the peritoneum was created and the infundibulo pelvic ligament was cross clamped, cut and suture ligated.  The right round ligament was suture ligated and cut with the electrocautery unit.  The vesicouterine serosal flap on the right was created.  An avascular window in the peritoneum was created and the right infundibulo pelvic ligament was cross  clamped, cut and double suture ligated.  Thus both ovaries were removed.  The uterine vessels were skeletonized bilaterally.  The uterine vessels were clamped bilaterally,  then cut and suture ligated.  Two more pedicles were taken down the cervix medial to the uterine vessels.  Each pedicle was clamped cut and suture ligated with good resulting hemostasis.  The vagina was cross clamped.  The specimen was removed.  The vagina was closed using interrupted figure of eight suture technique with goo resulting hemostasis.  The peritoneum was reapproximated anterior to posterior throughout the pelvis.  The pelvis was irrigated vigorously and all pedicles were examined and found to be hemostatic.    All specimens were sent to pathology for routine evaluation.  The Alexis self-retaining retractor was removed and the pelvis was irrigated vigorously.  All packs were removed and all counts were correct at this point x 3.  The muscles and peritoneum were reapproximated  loosely.  The fascia was closed with 0 Vicryl running.   The skin was closed using 3-0 Vicryl on a Keith needle in a subcuticular fashion.  Dermabond was then applied for additional wound integrity and to serve as a postoperative bacterial barrier.  The patient was awakened from anesthesia taken to the recovery room in good stable condition. All sponge instrument and needle counts were correct x 3.  The patient received 2 grams of  Ancef and 30 mg of Toradol prophylactically preoperatively.    Estimated blood loss for the procedure was 200  cc.  Bret Stamour H 09/04/2012 10:22 AM

## 2012-09-04 NOTE — Transfer of Care (Signed)
Immediate Anesthesia Transfer of Care Note  Patient: Kathryn Austin  Procedure(s) Performed: Procedure(s): HYSTERECTOMY ABDOMINAL (N/A) BILATERAL SALPINGO OOPHORECTOMY (Bilateral)  Patient Location: PACU  Anesthesia Type:General  Level of Consciousness: sedated  Airway & Oxygen Therapy: Patient Spontanous Breathing and Patient connected to face mask oxygen  Post-op Assessment: Report given to PACU RN and Post -op Vital signs reviewed and stable  Post vital signs: Reviewed and stable  Complications: No apparent anesthesia complications

## 2012-09-04 NOTE — Anesthesia Procedure Notes (Signed)
Procedure Name: Intubation Date/Time: 09/04/2012 8:37 AM Performed by: Caren Macadam Pre-anesthesia Checklist: Patient identified, Emergency Drugs available, Suction available and Patient being monitored Patient Re-evaluated:Patient Re-evaluated prior to inductionOxygen Delivery Method: Circle System Utilized Preoxygenation: Pre-oxygenation with 100% oxygen Intubation Type: IV induction Ventilation: Mask ventilation without difficulty Laryngoscope Size: Miller and 2 Grade View: Grade I Tube type: Oral Tube size: 7.0 mm Number of attempts: 1 Airway Equipment and Method: stylet and oral airway Placement Confirmation: ETT inserted through vocal cords under direct vision,  positive ETCO2 and breath sounds checked- equal and bilateral Secured at: 22 cm Tube secured with: Tape Dental Injury: Teeth and Oropharynx as per pre-operative assessment

## 2012-09-04 NOTE — H&P (Signed)
Preoperative History and Physical  Kathryn Austin is a 37 y.o. G4P0010 with Patient's last menstrual period was 08/30/2012.  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  Sonogram is ordered to do a preoperative evaluation  Please see the sonogram report  It is completely normal  Pending same the uterus does not descend at all in fact it feels stuck high  As a result the patient would need an abdominal hysterectomy with removal of the cervix the car she has mild dyspareunia  She's been debating with a remove the ovaries was normal C1 25 and sonogram is  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 Anxiety   . Hypertension   . GERD (gastroesophageal reflux disease)   . Active smoker 2014  . Neuromuscular disorder     dramata myocytis    PSH:     Past Surgical History  Procedure Laterality Date  . Cesarean section      x2  . Tonsillectomy    . Dilation and curettage of uterus    . Tubal ligation    . Cholecystectomy  03/31/2011    Procedure: LAPAROSCOPIC CHOLECYSTECTOMY;  Surgeon: Dalia Heading, MD;  Location: [facility];  Service: General;  Laterality: N/A;    POb/GynH:      OB History   Grav Para Term Preterm Abortions TAB SAB Ect Mult Living   4 3 0 0 1 0 1 0 0 0       SH:    History  Substance Use Topics  . Smoking status: Current Every Day Smoker -- 0.25 packs/day for 25 years    Types: Cigarettes  . Smokeless tobacco: Never Used  . Alcohol Use: No    FH:    Family History  Problem Relation Age of Onset  . Anesthesia problems Neg Hx   . Hypotension Neg Hx   . Malignant hyperthermia Neg Hx   . Pseudochol deficiency Neg Hx   . Diabetes Mother   . Hypertension Mother   . Cancer Father      Allergies: No Known Allergies  Medications:      Current facility-administered medications:bupivacaine liposome (EXPAREL) 1.3 % injection 266 mg, 20 mL, Infiltration, Once, Lazaro Arms, MD;  ceFAZolin (ANCEF) IVPB 2 g/50 mL premix, 2 g, Intravenous, On Call to OR, Lazaro Arms, MD;  ketorolac (TORADOL) 30 MG/ML injection 30 mg, 30 mg, Intravenous, Once, Lazaro Arms, MD  Review of Systems:   Review of Systems  Constitutional: Negative for fever, chills, weight loss, malaise/fatigue and diaphoresis.  HENT: Negative for hearing loss, ear pain, nosebleeds, congestion, sore throat, neck pain, tinnitus and ear discharge.   Eyes: Negative for blurred vision, double vision, photophobia, pain, discharge and  redness.  Respiratory: Negative for cough, hemoptysis, sputum production, shortness of breath, wheezing and stridor.   Cardiovascular: Negative for chest pain, palpitations, orthopnea, claudication, leg swelling and PND.  Gastrointestinal: Positive for abdominal pain. Negative for heartburn, nausea, vomiting, diarrhea, constipation, blood in stool and melena.  Genitourinary: Negative for dysuria, urgency, frequency, hematuria and flank pain.  Musculoskeletal: Negative for myalgias, back pain, joint pain and falls.  Skin: Negative for itching and rash.  Neurological: Negative for dizziness, tingling, tremors, sensory change, speech change, focal weakness, seizures, loss of consciousness, weakness and headaches.  Endo/Heme/Allergies: Negative for environmental  allergies and polydipsia. Does not bruise/bleed easily.  Psychiatric/Behavioral: Negative for depression, suicidal ideas, hallucinations, memory loss and substance abuse. The patient is not nervous/anxious and does not have insomnia.      PHYSICAL EXAM:  Blood pressure 108/69, temperature 98.1 F (36.7 C), temperature source Oral, resp. rate 17, height 5\' 8"  (1.727 m), weight 135 lb (61.236 kg), last menstrual period 08/30/2012, SpO2 97.00%.    Vitals reviewed. Constitutional: She is oriented to person, place, and time. She appears well-developed and well-nourished.  HENT:  Head: Normocephalic and atraumatic.  Right Ear: External ear normal.  Left Ear: External ear normal.  Nose: Nose normal.  Mouth/Throat: Oropharynx is clear and moist.  Eyes: Conjunctivae and EOM are normal. Pupils are equal, round, and reactive to light. Right eye exhibits no discharge. Left eye exhibits no discharge. No scleral icterus.  Neck: Normal range of motion. Neck supple. No tracheal deviation present. No thyromegaly present.  Cardiovascular: Normal rate, regular rhythm, normal heart sounds and intact distal pulses.  Exam reveals no gallop and no friction rub.   No murmur heard. Respiratory: Effort normal and breath sounds normal. No respiratory distress. She has no wheezes. She has no rales. She exhibits no tenderness.  GI: Soft. Bowel sounds are normal. She exhibits no distension and no mass. There is tenderness. There is no rebound and no guarding.  Genitourinary:       Vulva is normal without lesions Vagina is pink moist without discharge Cervix normal in appearance and pap is normal Uterus is normal by sonogram Adnexa is negative with normal sized ovaries by sonogram  Musculoskeletal: Normal range of motion. She exhibits no edema and no tenderness.  Neurological: She is alert and oriented to person, place, and time. She has normal reflexes. She displays normal reflexes. No cranial nerve deficit. She  exhibits normal muscle tone. Coordination normal.  Skin: Skin is warm and dry. No rash noted. No erythema. No pallor.  Psychiatric: She has a normal mood and affect. Her behavior is normal. Judgment and thought content normal.    Labs: Results for orders placed during the hospital encounter of 08/29/12 (from the past 336 hour(s))  URINALYSIS, ROUTINE W REFLEX MICROSCOPIC   Collection Time    08/29/12 12:52 PM      Result Value Range   Color, Urine YELLOW  YELLOW   APPearance CLEAR  CLEAR   Specific Gravity, Urine 1.010  1.005 - 1.030   pH 5.5  5.0 - 8.0   Glucose, UA NEGATIVE  NEGATIVE mg/dL   Hgb urine dipstick SMALL (*) NEGATIVE   Bilirubin Urine NEGATIVE  NEGATIVE   Ketones, ur NEGATIVE  NEGATIVE mg/dL   Protein, ur NEGATIVE  NEGATIVE mg/dL   Urobilinogen, UA 0.2  0.0 - 1.0 mg/dL   Nitrite NEGATIVE  NEGATIVE   Leukocytes, UA NEGATIVE  NEGATIVE  URINE MICROSCOPIC-ADD ON   Collection Time  08/29/12 12:52 PM      Result Value Range   Squamous Epithelial / LPF RARE  RARE   WBC, UA 0-2  <3 WBC/hpf   RBC / HPF 0-2  <3 RBC/hpf  CBC   Collection Time    08/29/12  1:05 PM      Result Value Range   WBC 6.5  4.0 - 10.5 K/uL   RBC 4.52  3.87 - 5.11 MIL/uL   Hemoglobin 14.4  12.0 - 15.0 g/dL   HCT 16.1  09.6 - 04.5 %   MCV 91.4  78.0 - 100.0 fL   MCH 31.9  26.0 - 34.0 pg   MCHC 34.9  30.0 - 36.0 g/dL   RDW 40.9  81.1 - 91.4 %   Platelets 281  150 - 400 K/uL  COMPREHENSIVE METABOLIC PANEL   Collection Time    08/29/12  1:05 PM      Result Value Range   Sodium 137  135 - 145 mEq/L   Potassium 4.2  3.5 - 5.1 mEq/L   Chloride 102  96 - 112 mEq/L   CO2 26  19 - 32 mEq/L   Glucose, Bld 103 (*) 70 - 99 mg/dL   BUN 7  6 - 23 mg/dL   Creatinine, Ser 7.82  0.50 - 1.10 mg/dL   Calcium 95.6  8.4 - 21.3 mg/dL   Total Protein 6.9  6.0 - 8.3 g/dL   Albumin 3.9  3.5 - 5.2 g/dL   AST 11  0 - 37 U/L   ALT 13  0 - 35 U/L   Alkaline Phosphatase 74  39 - 117 U/L   Total Bilirubin 0.1  (*) 0.3 - 1.2 mg/dL   GFR calc non Af Amer >90  >90 mL/min   GFR calc Af Amer >90  >90 mL/min  HCG, QUANTITATIVE, PREGNANCY   Collection Time    08/29/12  1:05 PM      Result Value Range   hCG, Beta Chain, Quant, S <1  <5 mIU/mL  TYPE AND SCREEN   Collection Time    08/29/12  1:05 PM      Result Value Range   ABO/RH(D) A NEG     Antibody Screen NEG     Sample Expiration 09/12/2012      EKG: Orders placed during the hospital encounter of 08/29/12  . EKG 12-LEAD  . EKG 12-LEAD    Imaging Studies: US Transvaginal Non-ob  2012/09/19    GYNECOLOGIC SONOGRAM   ANAI LIPSON is a 37 y.o. [id number] LMP-08/11/2012 for a pelvic sonogram  for dyspareunia, irregular cycles.  Uterus                      8.1 x 4.8 x 3.8 cm, anteverted, multiple small  hyperechoic foci noted within myometrium                    Endometrium          4.6 mm, symmetrical,   Right ovary             1.7 x 1.1 x 0.6 cm, Pt states no surgery to Rt  ovary (size noted to be smaller than LT)  Left ovary                3.2 x 2.7 x 2.5 cm,  Appears WNL  Small Nabothian Cyst noted in cervix=86mm  Technician Comments:  No free fluid or adnexal masses noted  Chari Manning 08/27/2012 1:32 PM  Clinical Impression and recommendations:  I have reviewed the sonogram results above, combined with the patient's  current clinical course, below are my impressions and any appropriate  recommendations for management based on the sonographic findings.  1.  Normal pelvic sonogram 2.  No finding to explain patient' clinical symptoms  Lyriq Jarchow H 08/27/2012 2:05 PM         Assessment: Patient Active Problem List   Diagnosis Date Noted  . 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 Dyspareunia 08/27/2012  . Dysmenorrhea 08/27/2012  . Menometrorrhagia 08/27/2012    Plan: Proceed with TAHBSO Pt understands the risks of surgery including but not limited t  excessive bleeding requiring transfusion or reoperation, post-operative infection requiring  prolonged hospitalization or re-hospitalization and antibiotic therapy, and damage to other organs including bladder, bowel, ureters and major vessels.  The patient also understands the alternative treatment options which were discussed in full.  All questions were answered.  Hellon Vaccarella H 09/04/2012 8:13 AM   Baelyn Doring H 09/04/2012 8:12 AM

## 2012-09-04 NOTE — Progress Notes (Signed)
Was holding patient until bed available

## 2012-09-05 LAB — BASIC METABOLIC PANEL
CO2: 27 mEq/L (ref 19–32)
Calcium: 8.9 mg/dL (ref 8.4–10.5)
Chloride: 106 mEq/L (ref 96–112)
Glucose, Bld: 122 mg/dL — ABNORMAL HIGH (ref 70–99)
Potassium: 4.2 mEq/L (ref 3.5–5.1)
Sodium: 138 mEq/L (ref 135–145)

## 2012-09-05 LAB — CBC
Hemoglobin: 12.5 g/dL (ref 12.0–15.0)
MCH: 32 pg (ref 26.0–34.0)
Platelets: 280 10*3/uL (ref 150–400)
RBC: 3.91 MIL/uL (ref 3.87–5.11)
WBC: 10.7 10*3/uL — ABNORMAL HIGH (ref 4.0–10.5)

## 2012-09-05 MED ORDER — ONDANSETRON HCL 8 MG PO TABS: 8.0000 mg | ORAL_TABLET | Freq: Four times a day (QID) | ORAL | Status: DC | PRN

## 2012-09-05 MED ORDER — OXYCODONE-ACETAMINOPHEN 5-325 MG PO TABS: 1.0000 | ORAL_TABLET | ORAL | Status: DC | PRN

## 2012-09-05 NOTE — Progress Notes (Signed)
Patient states understanding of discharge instructions, prescriptions given 

## 2012-09-06 ENCOUNTER — Other Ambulatory Visit: Payer: Self-pay | Admitting: Obstetrics & Gynecology

## 2012-09-06 NOTE — Discharge Summary (Signed)
Physician Discharge Summary  Patient ID: Kathryn Austin MRN: [id number] DOB/AGE: Feb 13, 1976 37 y.o.  Admit date: 09/04/2012 Discharge date: 09/06/2012  Admission Diagnoses:Dyspareunia dysmenorrhea chronic pelvic pain  Discharge Diagnoses:  same  Discharged Condition: good  Hospital Course: unremarkable  Consults: None  Significant Diagnostic Studies: none  Treatments: surgery: tahbso  Discharge Exam: Blood pressure 113/74, pulse 56, temperature 98.1 F (36.7 C), temperature source Oral, resp. rate 16, height 5\' 8"  (1.727 m), weight 140 lb 12.8 oz (63.866 kg), last menstrual period 08/30/2012, SpO2 100.00%. General appearance: alert, cooperative and no distress GI: soft, non-tender; bowel sounds normal; no masses,  no organomegaly and incison clean dry intact  Disposition: 01-Home or Self Care  Discharge Orders   Future Appointments Provider Department Dept Phone   09/13/2012 1:15 PM Lazaro Arms, MD [facility] [phone]   Future Orders Complete By Expires     Call MD for:  persistant nausea and vomiting  As directed     Call MD for:  severe uncontrolled pain  As directed     Call MD for:  temperature >100.4  As directed     Diet - low sodium heart healthy  As directed     Discharge wound care:  As directed     Comments:      Keep clean and dry    Driving Restrictions  As directed     Comments:      No driving until released by Dr Despina Hidden    Increase activity slowly  As directed     Lifting restrictions  As directed     Comments:      No more than 10 pounds    Sexual Activity Restrictions  As directed     Comments:      None for 8 weeks        Medication List         ALPRAZolam 0.5 MG tablet  Commonly known as:  XANAX  Take 0.5 mg by mouth at bedtime as needed for sleep or anxiety. anxiety     amphetamine-dextroamphetamine 20 MG tablet  Commonly known as:  ADDERALL  Take 10 mg by mouth daily.     buPROPion 300 MG 24 hr tablet  Commonly known  as:  WELLBUTRIN XL  Take 300 mg by mouth daily.     hydroxychloroquine 200 MG tablet  Commonly known as:  PLAQUENIL  Take 200 mg by mouth 2 (two) times daily.     nebivolol 5 MG tablet  Commonly known as:  BYSTOLIC  Take 5 mg by mouth daily.     ondansetron 8 MG tablet  Commonly known as:  ZOFRAN  Take 1 tablet (8 mg total) by mouth every 6 (six) hours as needed for nausea.     oxyCODONE-acetaminophen 5-325 MG per tablet  Commonly known as:  PERCOCET/ROXICET  Take 1-2 tablets by mouth every 4 (four) hours as needed.     QNASL 80 MCG/ACT Aers  Generic drug:  Beclomethasone Dipropionate  Place 1 spray into the nose daily as needed (allergies).           Follow-up Information   Follow up with Lazaro Arms, MD In 1 week.   Contact information:   [address] [place] [address] [phone]       Signed: Lazaro Arms 09/06/2012, 12:14 PM

## 2012-09-08 MED ORDER — ONDANSETRON HCL 8 MG PO TABS: 8.0000 mg | ORAL_TABLET | Freq: Four times a day (QID) | ORAL | Status: DC | PRN

## 2012-09-09 ENCOUNTER — Encounter (HOSPITAL_COMMUNITY): Payer: Self-pay | Admitting: Obstetrics & Gynecology

## 2012-09-09 ENCOUNTER — Telehealth: Payer: Self-pay | Admitting: Obstetrics & Gynecology

## 2012-09-09 MED ORDER — HYDROCODONE-ACETAMINOPHEN 5-325 MG PO TABS: 1.0000 | ORAL_TABLET | Freq: Four times a day (QID) | ORAL | Status: DC | PRN

## 2012-09-09 NOTE — Telephone Encounter (Signed)
Pt had a hysterectomy last Wednesday. Out of pain medication. Abdominal hysterectomy, having a lot of pain. Rates pain at a 5. Wants more pain medication. Was given percocet after surgery, wants something called in. "Not ready to be completely off pain medication".

## 2012-09-13 ENCOUNTER — Ambulatory Visit (INDEPENDENT_AMBULATORY_CARE_PROVIDER_SITE_OTHER): Payer: Commercial Managed Care - PPO | Admitting: Obstetrics & Gynecology

## 2012-09-13 ENCOUNTER — Encounter: Payer: Self-pay | Admitting: Obstetrics & Gynecology

## 2012-09-13 VITALS — BP 102/78 | Wt 134.0 lb

## 2012-09-13 DIAGNOSIS — Z9889 Other specified postprocedural states: Secondary | ICD-10-CM

## 2012-09-13 MED ORDER — ESTRADIOL 0.52 MG/0.87 GM (0.06%) TD GEL: 2.0000 "application " | Freq: Every day | TRANSDERMAL | Status: DC

## 2012-09-13 NOTE — Patient Instructions (Signed)

## 2012-09-13 NOTE — Progress Notes (Signed)
Patient ID: Kathryn Austin, female   DOB: 03/24/1975, 37 y.o.   MRN: [id number] [name] is 9 days out from an abdominal hysterectomy with removal of both ovaries She is not really had any pure as showing withdrawal symptoms She has had some nausea not really throwing up using the Zofran She has been somewhat constipated predictably  On exam today her incision is clean dry and intact the surgical glue Dermabond is in place She has some mild bruising which is not unexpected She's had minimal spotting postoperatively  I recommended MiraLAX for constipation and we will start her on topical estrogen probably 2 squirts her 2 doses daily I will see her back in 4 weeks for final postop visit

## 2012-10-04 ENCOUNTER — Encounter (INDEPENDENT_AMBULATORY_CARE_PROVIDER_SITE_OTHER): Payer: Self-pay | Admitting: *Deleted

## 2012-10-07 ENCOUNTER — Telehealth: Payer: Self-pay | Admitting: Obstetrics & Gynecology

## 2012-10-08 ENCOUNTER — Telehealth: Payer: Self-pay | Admitting: Obstetrics & Gynecology

## 2012-10-08 ENCOUNTER — Telehealth: Payer: Self-pay | Admitting: *Deleted

## 2012-10-08 MED ORDER — ESTRADIOL 0.52 MG/0.87 GM (0.06%) TD GEL: 2.0000 "application " | Freq: Every day | TRANSDERMAL | Status: DC

## 2012-10-08 NOTE — Telephone Encounter (Signed)
No answer at [facility] in [place] to verify RX rcd.

## 2012-10-09 ENCOUNTER — Ambulatory Visit (INDEPENDENT_AMBULATORY_CARE_PROVIDER_SITE_OTHER): Payer: Commercial Managed Care - PPO | Admitting: Obstetrics & Gynecology

## 2012-10-09 ENCOUNTER — Encounter: Payer: Self-pay | Admitting: Obstetrics & Gynecology

## 2012-10-09 VITALS — BP 122/68 | Ht 68.0 in | Wt 133.5 lb

## 2012-10-09 DIAGNOSIS — Z9889 Other specified postprocedural states: Secondary | ICD-10-CM

## 2012-10-09 DIAGNOSIS — N390 Urinary tract infection, site not specified: Secondary | ICD-10-CM

## 2012-10-09 LAB — POCT URINALYSIS DIPSTICK
Glucose, UA: NEGATIVE
Ketones, UA: NEGATIVE
Leukocytes, UA: NEGATIVE
Protein, UA: NEGATIVE

## 2012-10-09 NOTE — Progress Notes (Signed)
Patient ID: Kathryn Austin, female   DOB: [birth year], 37 y.o.   MRN: [id number] Kathryn Austin's and for a final postoperative visit She had a an abdominal hysterectomy and BSO I and 09/04/2012 Her surgery was without complication Her postoperative course has been without complication  Her incision is clean dry and intact Her bowel exam is benign  The vaginal cuff is still healing sutures are present Bimanual reveals no masses or hematomas or undue tenderness  No intercourse for 3 or 4 more weeks Continue Elestrin gel 2 squirts a day

## 2012-11-04 ENCOUNTER — Telehealth: Payer: Self-pay | Admitting: Obstetrics & Gynecology

## 2012-11-05 ENCOUNTER — Encounter (INDEPENDENT_AMBULATORY_CARE_PROVIDER_SITE_OTHER): Payer: Self-pay | Admitting: Internal Medicine

## 2012-11-05 ENCOUNTER — Ambulatory Visit (INDEPENDENT_AMBULATORY_CARE_PROVIDER_SITE_OTHER): Payer: Commercial Managed Care - PPO | Admitting: Internal Medicine

## 2012-11-05 VITALS — BP 97/60 | HR 72 | Temp 97.7°F | Ht 68.0 in | Wt 136.1 lb

## 2012-11-05 DIAGNOSIS — M3390 Dermatopolymyositis, unspecified, organ involvement unspecified: Secondary | ICD-10-CM

## 2012-11-05 DIAGNOSIS — K5900 Constipation, unspecified: Secondary | ICD-10-CM

## 2012-11-05 DIAGNOSIS — K219 Gastro-esophageal reflux disease without esophagitis: Secondary | ICD-10-CM

## 2012-11-05 DIAGNOSIS — M360 Dermato(poly)myositis in neoplastic disease: Secondary | ICD-10-CM

## 2012-11-05 LAB — CBC WITH DIFFERENTIAL/PLATELET
Basophils Absolute: 0 10*3/uL (ref 0.0–0.1)
Eosinophils Relative: 1 % (ref 0–5)
Lymphocytes Relative: 28 % (ref 12–46)
MCV: 91.5 fL (ref 78.0–100.0)
Neutrophils Relative %: 66 % (ref 43–77)
Platelets: 273 10*3/uL (ref 150–400)
RDW: 13.9 % (ref 11.5–15.5)
WBC: 6.9 10*3/uL (ref 4.0–10.5)

## 2012-11-05 MED ORDER — PANTOPRAZOLE SODIUM 40 MG PO TBEC: 40.0000 mg | DELAYED_RELEASE_TABLET | Freq: Every day | ORAL | Status: DC

## 2012-11-05 NOTE — Patient Instructions (Addendum)
CBC, CMet, and sedrate. OV in 1 months. I am going to discuss with Dr. Karilyn Cota.

## 2012-11-05 NOTE — Progress Notes (Signed)
Subjective:     Patient ID: Kathryn Austin, female   DOB: 11/03/1975, 37 y.o.   MRN: [id number]  HPI Referred to our office by [facility] for constipation/diarrhea.  She has had constipation for most of her life.  She tells me she is alternating between constipating and diarrhea. She says her symptoms have become worse. She has bloating and nausea constantly, She has been on the Methotrexate and the Plaquenil for 2 months.. She was diagnosed with dermato myositis. She had joint and muscle aches.  In June. She had a biopsy at [facility] by Dr. Reginia Naas. She tells me she lost about 25 pounds. She experienced joint and muscle pain. She did have a fever at times. She is taking a stool softener or a laxative and she stills has a small bowel. When she first started the Plaquenil and Methotrexate she had diarrhea. They reduced the dose of Methotrexate and she became more constipated. She sometimes will wake up with back pain radiating into her abdomen. Pain will last 30 minutes but she has the symptoms off an on during the day.  Occurring for 2-3 weeks. She takes Miralax for her constipation which is helping.  She usually has a BM every 2-3 days. She says when she does goes her stools are small and thin.  Symptoms started with the Methotrexate. No recent weight loss. Appetite is better. She feels bloated at times. Frequent acid reflux No bright red rectal bleeding. She tells me she had tarry stools right after starting the Methotrexate. No tarry stools after the Methotrexate was reduced.   Hx of dermato myositis and take Plaquenil and Methotrexate.  Review of Systems Current Outpatient Prescriptions  Medication Sig Dispense Refill  . ALPRAZolam (XANAX) 0.5 MG tablet Take 0.5 mg by mouth at bedtime as needed for sleep or anxiety. anxiety      . amphetamine-dextroamphetamine (ADDERALL) 20 MG tablet Take 10 mg by mouth daily.      Marland Kitchen buPROPion (WELLBUTRIN XL) 300 MG 24 hr tablet Take 300 mg by  mouth daily.      . Estradiol 0.52 MG/0.87 GM (0.06%) GEL Apply 2 application topically daily.  1 Bottle  11  . folic acid (FOLVITE) 1 MG tablet Take 1 mg by mouth daily.      . hydroxychloroquine (PLAQUENIL) 200 MG tablet Take 200 mg by mouth 2 (two) times daily.       . methotrexate (RHEUMATREX) 10 MG tablet Take 5 mg by mouth once a week. Caution: Chemotherapy. Protect from light.      . nebivolol (BYSTOLIC) 5 MG tablet Take 5 mg by mouth daily.      Marland Kitchen nystatin (MYCOSTATIN) 100000 UNIT/ML suspension Take 500,000 Units by mouth as needed.       . ranitidine (ZANTAC) 150 MG tablet Take 150 mg by mouth as needed for heartburn.      . pantoprazole (PROTONIX) 40 MG tablet Take 1 tablet (40 mg total) by mouth daily.  30 tablet  3   No current facility-administered medications for this visit.   Past Medical History  Diagnosis Date  . Anxiety   . Hypertension   . GERD (gastroesophageal reflux disease)   . Active smoker 2014  . Neuromuscular disorder     dramata myocytis   Past Surgical History  Procedure Laterality Date  . Cesarean section      x2  . Tonsillectomy    . Dilation and curettage of uterus    . Tubal ligation    .  Cholecystectomy  03/31/2011    Procedure: LAPAROSCOPIC CHOLECYSTECTOMY;  Surgeon: Dalia Heading, MD;  Location: [facility];  Service: General;  Laterality: N/A;  . Abdominal hysterectomy N/A 09/04/2012    Procedure: HYSTERECTOMY ABDOMINAL;  Surgeon: Lazaro Arms, MD;  Location: [facility];  Service: Gynecology;  Laterality: N/A;  . Salpingoophorectomy Bilateral 09/04/2012    Procedure: BILATERAL SALPINGO OOPHORECTOMY;  Surgeon: Lazaro Arms, MD;  Location: [facility];  Service: Gynecology;  Laterality: Bilateral;   No Known Allergies     Objective:   Physical Exam  Filed Vitals:   11/05/12 1512  BP: 97/60  Pulse: 72  Temp: 97.7 F (36.5 C)  Height: 5\' 8"  (1.727 m)  Weight: 136 lb 1.6 oz (61.735 kg)   Alert and oriented. Skin warm and dry. Oral mucosa is moist.   .  Sclera anicteric, conjunctivae is pink. Thyroid not enlarged. No cervical lymphadenopathy. Lungs clear. Heart regular rate and rhythm.  Abdomen is soft. Bowel sounds are positive. No hepatomegaly. No abdominal masses felt. No tenderness.  No edema to lower extremities. Stool brown and guaiac negative.     Assessment:    Constipation. Better with the Miralax. Patient however is concerned because she is at a high risk for cancer with dermato myositis.    GERD not controlled at this time.  Plan:    CBC, sedrate, and Cmet today. I am going to talk with Dr. Karilyn Cota about a possible colonoscopy and EGD. Protonix 40mg  po daily. Stop the Zantac.

## 2012-11-06 LAB — COMPREHENSIVE METABOLIC PANEL
ALT: 11 U/L (ref 0–35)
AST: 12 U/L (ref 0–37)
Chloride: 107 mEq/L (ref 96–112)
Creat: 0.75 mg/dL (ref 0.50–1.10)
Sodium: 142 mEq/L (ref 135–145)
Total Bilirubin: 0.3 mg/dL (ref 0.3–1.2)

## 2012-11-07 ENCOUNTER — Telehealth (INDEPENDENT_AMBULATORY_CARE_PROVIDER_SITE_OTHER): Payer: Self-pay | Admitting: Internal Medicine

## 2012-11-07 ENCOUNTER — Telehealth (INDEPENDENT_AMBULATORY_CARE_PROVIDER_SITE_OTHER): Payer: Self-pay | Admitting: *Deleted

## 2012-11-07 NOTE — Telephone Encounter (Signed)
No answer at home #

## 2012-11-07 NOTE — Telephone Encounter (Signed)
Kathryn Austin's constipation and bloating issues have gotten worse. She said Camelia Eng was to talk with Dr. Karilyn Cota and get back with her.  Please return her call at [phone].

## 2012-11-07 NOTE — Telephone Encounter (Signed)
07/25/2012 Borrelis burgdorferi not detected.:Labs (Negative less than 90).

## 2012-11-07 NOTE — Telephone Encounter (Signed)
I have made several attempts to call patient. Will call back in the morning.

## 2012-11-08 ENCOUNTER — Telehealth: Payer: Self-pay | Admitting: Obstetrics & Gynecology

## 2012-11-08 ENCOUNTER — Telehealth (INDEPENDENT_AMBULATORY_CARE_PROVIDER_SITE_OTHER): Payer: Self-pay | Admitting: Internal Medicine

## 2012-11-08 MED ORDER — ESTRADIOL 2 MG PO TABS: 2.0000 mg | ORAL_TABLET | Freq: Every day | ORAL | Status: DC

## 2012-11-08 NOTE — Telephone Encounter (Signed)
Spoke with pt. Advised her insurance don't cover any topical meds so will have to use oral. Estrace 2 mg e prescribed per Dr. Despina Hidden. JSY

## 2012-11-08 NOTE — Telephone Encounter (Signed)
Spoke with pt. Estradiol Gel is to expensive, $50.00. Can you call in something cheaper? [facility] [place] [facility] [address] Thanks!!!

## 2012-11-08 NOTE — Telephone Encounter (Signed)
I have discussed with Dr. Karilyn Cota. He is going to proceed with an EGD/ED. I have left a message at her home. She does not need LFTs. That was documented in error.

## 2012-11-11 ENCOUNTER — Telehealth (INDEPENDENT_AMBULATORY_CARE_PROVIDER_SITE_OTHER): Payer: Self-pay | Admitting: Internal Medicine

## 2012-11-11 NOTE — Telephone Encounter (Signed)
I talked with patient. She will have an EGD/Colonoscopy. Message has been sent to Kathryn Austin

## 2012-11-11 NOTE — Telephone Encounter (Signed)
I discussed this case with Dr Karilyn Cota. I have also spoken with patient. Patient needs an EGD/Colonoscopy.    Ann, EGD/Colonoscopy, please schedule. NKA

## 2012-11-12 ENCOUNTER — Telehealth (INDEPENDENT_AMBULATORY_CARE_PROVIDER_SITE_OTHER): Payer: Self-pay | Admitting: *Deleted

## 2012-11-12 ENCOUNTER — Other Ambulatory Visit (INDEPENDENT_AMBULATORY_CARE_PROVIDER_SITE_OTHER): Payer: Self-pay | Admitting: *Deleted

## 2012-11-12 DIAGNOSIS — Z1211 Encounter for screening for malignant neoplasm of colon: Secondary | ICD-10-CM

## 2012-11-12 DIAGNOSIS — K219 Gastro-esophageal reflux disease without esophagitis: Secondary | ICD-10-CM

## 2012-11-12 DIAGNOSIS — K5900 Constipation, unspecified: Secondary | ICD-10-CM

## 2012-11-12 DIAGNOSIS — R197 Diarrhea, unspecified: Secondary | ICD-10-CM

## 2012-11-12 NOTE — Telephone Encounter (Signed)
TCS/EGD sch'd 12/05/12, patient aware

## 2012-11-12 NOTE — Telephone Encounter (Signed)
Patient needs movi prep 

## 2012-11-12 NOTE — Telephone Encounter (Signed)
No answer

## 2012-11-12 NOTE — Telephone Encounter (Signed)
Left message on machine.

## 2012-11-13 MED ORDER — PEG-KCL-NACL-NASULF-NA ASC-C 100 G PO SOLR: 1.0000 | Freq: Once | ORAL | Status: DC

## 2012-11-22 ENCOUNTER — Encounter (HOSPITAL_COMMUNITY): Payer: Self-pay | Admitting: Pharmacy Technician

## 2012-12-03 ENCOUNTER — Encounter (INDEPENDENT_AMBULATORY_CARE_PROVIDER_SITE_OTHER): Payer: Self-pay

## 2012-12-03 ENCOUNTER — Telehealth (INDEPENDENT_AMBULATORY_CARE_PROVIDER_SITE_OTHER): Payer: Self-pay | Admitting: *Deleted

## 2012-12-03 NOTE — Telephone Encounter (Signed)
Patient called to cancel TCS/EGD, she states her insurance has been terminated.  She has applied for Medicaid but hasn't heard anything yet. She said she will call to reschedule once she knows if she gets Medicaid

## 2012-12-04 NOTE — Telephone Encounter (Signed)
noted 

## 2012-12-05 ENCOUNTER — Ambulatory Visit (HOSPITAL_COMMUNITY): Admission: RE | Admit: 2012-12-05 | Payer: Self-pay | Source: Ambulatory Visit | Admitting: Internal Medicine

## 2012-12-05 ENCOUNTER — Ambulatory Visit (INDEPENDENT_AMBULATORY_CARE_PROVIDER_SITE_OTHER): Payer: Commercial Managed Care - PPO | Admitting: Internal Medicine

## 2012-12-05 ENCOUNTER — Encounter (HOSPITAL_COMMUNITY): Admission: RE | Payer: Self-pay | Source: Ambulatory Visit

## 2012-12-05 SURGERY — COLONOSCOPY WITH ESOPHAGOGASTRODUODENOSCOPY (EGD)
Anesthesia: Moderate Sedation

## 2012-12-25 ENCOUNTER — Other Ambulatory Visit (INDEPENDENT_AMBULATORY_CARE_PROVIDER_SITE_OTHER): Payer: Self-pay | Admitting: *Deleted

## 2012-12-25 ENCOUNTER — Telehealth (INDEPENDENT_AMBULATORY_CARE_PROVIDER_SITE_OTHER): Payer: Self-pay | Admitting: *Deleted

## 2012-12-25 DIAGNOSIS — K219 Gastro-esophageal reflux disease without esophagitis: Secondary | ICD-10-CM

## 2012-12-25 DIAGNOSIS — Z1211 Encounter for screening for malignant neoplasm of colon: Secondary | ICD-10-CM

## 2012-12-25 DIAGNOSIS — K5900 Constipation, unspecified: Secondary | ICD-10-CM

## 2012-12-25 NOTE — Telephone Encounter (Signed)
Patient needs movi prep 

## 2012-12-26 MED ORDER — PEG-KCL-NACL-NASULF-NA ASC-C 100 G PO SOLR: 1.0000 | Freq: Once | ORAL | Status: DC

## 2013-01-02 ENCOUNTER — Other Ambulatory Visit: Payer: Self-pay

## 2013-01-28 ENCOUNTER — Encounter (HOSPITAL_COMMUNITY): Payer: Self-pay | Admitting: Pharmacy Technician

## 2013-01-29 ENCOUNTER — Encounter (HOSPITAL_COMMUNITY)
Admission: RE | Disposition: A | Discharge status: Home / Self Care | Payer: Self-pay | Source: Ambulatory Visit | Attending: Internal Medicine

## 2013-01-29 ENCOUNTER — Encounter (HOSPITAL_COMMUNITY): Payer: Self-pay

## 2013-01-29 ENCOUNTER — Ambulatory Visit (HOSPITAL_COMMUNITY)
Admission: RE | Admit: 2013-01-29 | Discharge: 2013-01-29 | Disposition: A | Discharge status: Home / Self Care | Payer: Medicaid Other | Source: Ambulatory Visit | Attending: Internal Medicine | Admitting: Internal Medicine

## 2013-01-29 DIAGNOSIS — K2960 Other gastritis without bleeding: Secondary | ICD-10-CM | POA: Insufficient documentation

## 2013-01-29 DIAGNOSIS — K208 Other esophagitis: Secondary | ICD-10-CM

## 2013-01-29 DIAGNOSIS — R198 Other specified symptoms and signs involving the digestive system and abdomen: Secondary | ICD-10-CM

## 2013-01-29 DIAGNOSIS — K644 Residual hemorrhoidal skin tags: Secondary | ICD-10-CM | POA: Insufficient documentation

## 2013-01-29 DIAGNOSIS — R110 Nausea: Secondary | ICD-10-CM

## 2013-01-29 DIAGNOSIS — K2970 Gastritis, unspecified, without bleeding: Secondary | ICD-10-CM

## 2013-01-29 DIAGNOSIS — I10 Essential (primary) hypertension: Secondary | ICD-10-CM | POA: Insufficient documentation

## 2013-01-29 DIAGNOSIS — R112 Nausea with vomiting, unspecified: Secondary | ICD-10-CM | POA: Insufficient documentation

## 2013-01-29 DIAGNOSIS — D128 Benign neoplasm of rectum: Secondary | ICD-10-CM | POA: Insufficient documentation

## 2013-01-29 DIAGNOSIS — R634 Abnormal weight loss: Secondary | ICD-10-CM

## 2013-01-29 DIAGNOSIS — K2990 Gastroduodenitis, unspecified, without bleeding: Secondary | ICD-10-CM

## 2013-01-29 DIAGNOSIS — K2100 Gastro-esophageal reflux disease with esophagitis, without bleeding: Secondary | ICD-10-CM | POA: Insufficient documentation

## 2013-01-29 DIAGNOSIS — K5900 Constipation, unspecified: Secondary | ICD-10-CM

## 2013-01-29 DIAGNOSIS — D129 Benign neoplasm of anus and anal canal: Secondary | ICD-10-CM

## 2013-01-29 DIAGNOSIS — R630 Anorexia: Secondary | ICD-10-CM | POA: Insufficient documentation

## 2013-01-29 DIAGNOSIS — K219 Gastro-esophageal reflux disease without esophagitis: Secondary | ICD-10-CM

## 2013-01-29 HISTORY — PX: COLONOSCOPY WITH ESOPHAGOGASTRODUODENOSCOPY (EGD): SHX5779

## 2013-01-29 SURGERY — COLONOSCOPY WITH ESOPHAGOGASTRODUODENOSCOPY (EGD)
Anesthesia: Moderate Sedation

## 2013-01-29 MED ORDER — SODIUM CHLORIDE 0.9 % IV SOLN
INTRAVENOUS | Status: DC
  Administered 2013-01-29: 13:00:00 via INTRAVENOUS

## 2013-01-29 MED ORDER — PROMETHAZINE HCL 25 MG/ML IJ SOLN
INTRAMUSCULAR | Status: DC | PRN
  Administered 2013-01-29: 12.5 mg via INTRAVENOUS

## 2013-01-29 MED ORDER — MEPERIDINE HCL 50 MG/ML IJ SOLN
INTRAMUSCULAR | Status: DC | PRN
  Administered 2013-01-29 (×2): 25 mg via INTRAVENOUS

## 2013-01-29 MED ORDER — SODIUM CHLORIDE 0.9 % IJ SOLN
INTRAMUSCULAR | Status: AC
  Filled 2013-01-29: qty 10

## 2013-01-29 MED ORDER — MEPERIDINE HCL 50 MG/ML IJ SOLN
INTRAMUSCULAR | Status: AC
  Filled 2013-01-29: qty 1

## 2013-01-29 MED ORDER — PROMETHAZINE HCL 25 MG/ML IJ SOLN
INTRAMUSCULAR | Status: AC
  Filled 2013-01-29: qty 1

## 2013-01-29 MED ORDER — STERILE WATER FOR IRRIGATION IR SOLN
Status: DC | PRN
  Administered 2013-01-29: 14:00:00

## 2013-01-29 MED ORDER — BUTAMBEN-TETRACAINE-BENZOCAINE 2-2-14 % EX AERO
INHALATION_SPRAY | CUTANEOUS | Status: DC | PRN
  Administered 2013-01-29: 2 via TOPICAL

## 2013-01-29 MED ORDER — MIDAZOLAM HCL 5 MG/5ML IJ SOLN
INTRAMUSCULAR | Status: DC | PRN
  Administered 2013-01-29 (×4): 2 mg via INTRAVENOUS

## 2013-01-29 MED ORDER — MIDAZOLAM HCL 5 MG/5ML IJ SOLN
INTRAMUSCULAR | Status: AC
  Filled 2013-01-29: qty 10

## 2013-01-29 NOTE — Op Note (Signed)
EGD PROCEDURE REPORT  PATIENT:  Kathryn Austin  MR#:  [id number] Birthdate:  30-May-1975, 37 y.o., female Endoscopist:  Dr. Malissa Hippo, MD Referred By:  Lenise Herald, [facility]  Procedure Date: 01/29/2013  Procedure:   EGD & Colonoscopy  Indications:  Patient is 37 year old Caucasian female who presents with recurrent nausea anorexia weight loss and irregular bowel movements. Lately she has been constipated. She denies melena or rectal bleeding. 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 She is undergoing diagnostic EGD and colonoscopy.            Informed Consent:  The risks, benefits, alternatives & imponderables which include, but are not limited to, bleeding, infection, perforation, drug reaction and potential missed lesion have been reviewed.  The potential for biopsy, lesion removal, esophageal dilation, etc. have also been discussed.  Questions have been answered.  All parties agreeable.  Please see history & physical in medical record for more information.  Medications:  Demerol 50 mg IV Versed 8 mg IV Promethazine 12.5 mg IV and diluted form. Cetacaine spray topically for oropharyngeal anesthesia  EGD  Description of procedure:  The endoscope was introduced through the mouth and advanced to the second portion of the duodenum without difficulty or limitations. The mucosal surfaces were surveyed very carefully during advancement of the scope and upon withdrawal.  Findings:  Esophagus:  Mucosa of the esophagus was normal. Focal erythema and edema noted at GE junction. No ring or stricture present. GEJ:  40 cm Stomach:  Stomach was empty and distended very well with insufflation. Folds in the proximal stomach are normal. Examination mucosa and body was normal. Patchy erythema and granularity noted at antrum. No erosions or ulcer noted. Pyloric channel was patent. Angularis, fundus and cardia were examined by retroflexion of the  scope and were unremarkable. Duodenum:  Normal bulbar and post bulbar mucosa.  Therapeutic/Diagnostic Maneuvers Performed:  None  COLONOSCOPY Description of procedure:  After a digital rectal exam was performed, that colonoscope was advanced from the anus through the rectum and colon to the area of the cecum, ileocecal valve and appendiceal orifice. The cecum was deeply intubated. These structures were well-seen and photographed for the record. From the level of the cecum and ileocecal valve, the scope was slowly and cautiously withdrawn. The mucosal surfaces were carefully surveyed utilizing scope tip to flexion to facilitate fold flattening as needed. The scope was pulled down into the rectum where a thorough exam including retroflexion was performed.  Findings:   Prep satisfactory. Normal mucosa of colon. 4 mm rectal polyp ablated while cold biopsy. Small hemorrhoids below the dentate line.  Therapeutic/Diagnostic Maneuvers Performed:  See above  Complications:  None  Cecal Withdrawal Time:  14  minutes  Impression:   EGD findings; Mild changes of reflux esophagitis limited to GE junction. Nonerosive antral gastritis.  Colonoscopy findings; Examination performed to cecum. 4 mm rectal polyp ablated via cold biopsy. Small external hemorrhoids.   Recommendations:  Dexilant 60 mg by mouth every morning(patient to pick up samples from the office). H. pylori serology. High fiber diet. I will contact patient with results of biopsy and blood test and further recommendations.  REHMAN,NAJEEB U  01/29/2013 2:37 PM  CC: Dr. Loreta Ave, Sharlet Salina, PA-C & Dr. Bonnetta Barry ref. provider found

## 2013-01-29 NOTE — H&P (Signed)
Kathryn Austin is an 37 y.o. female.   Chief Complaint: Patient is here here for EGD and colonoscopy. 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 She presents with frequent if not daily nausea loss of appetite and 25 pound weight loss the last 3-4 months. She also complains of constipation and/or diarrhea. She denies melena or rectal bleeding. She wants to the evaluated to make sure she does not have occult colon carcinoma which can be associated with dermatomyositis. She is status post cholecystectomy last year. He does not take NSAIDs.  Past Medical History  Diagnosis Date  . Anxiety   . Hypertension   . GERD (gastroesophageal reflux disease)   . Active smoker 2014  . Neuromuscular disorder     dramata myocytis    Past Surgical History  Procedure Laterality Date  . Cesarean section      x2  . Tonsillectomy    . Dilation and curettage of uterus    . Tubal ligation    . Cholecystectomy  03/31/2011    Procedure: LAPAROSCOPIC CHOLECYSTECTOMY;  Surgeon: Kathryn Austin;  Location: [facility];  Service: General;  Laterality: N/A;  . Abdominal hysterectomy N/A 09/04/2012    Procedure: HYSTERECTOMY ABDOMINAL;  Surgeon: Kathryn Austin;  Location: [facility];  Service: Gynecology;  Laterality: N/A;  . Salpingoophorectomy Bilateral 09/04/2012    Procedure: BILATERAL SALPINGO OOPHORECTOMY;  Surgeon: Kathryn Austin;  Location: [facility];  Service: Gynecology;  Laterality: Bilateral;    Family History  Problem Relation Age of Onset  . Anesthesia problems Neg Hx   . Hypotension Neg Hx   . Malignant hyperthermia Neg Hx   . Pseudochol deficiency Neg Hx   . Diabetes Mother   . Hypertension Mother   . COPD Mother   . Heart disease Mother   . Stroke Mother   . Cancer Father    Social History:  reports that she has been smoking Cigarettes.  She has a 6.25 pack-year smoking history. She has never used  smokeless tobacco. She reports that she does not drink alcohol or use illicit drugs.  Allergies: No Known Allergies  Medications Prior to Admission  Medication Sig Dispense Refill  . ALPRAZolam (XANAX) 0.5 MG tablet Take 0.5 mg by mouth 3 (three) times daily as needed for sleep or anxiety. anxiety      . estradiol (ESTRACE) 2 MG tablet Take 1 tablet (2 mg total) by mouth daily.  30 tablet  11  . folic acid (FOLVITE) 1 MG tablet Take 1 mg by mouth daily.      Kathryn Austin (NORCO/VICODIN) 5-325 MG per tablet Take 1 tablet by mouth every 6 (six) hours as needed for moderate pain.      . hydroxychloroquine (PLAQUENIL) 200 MG tablet Take 200 mg by mouth 2 (two) times daily.       . methotrexate (RHEUMATREX) 2.5 MG tablet Take 10 mg by mouth once a week. Caution:Chemotherapy. Protect from light. Takes on Sunday        No results found for this or any previous visit (from the past 48 hour(s)). No results found.  ROS  Blood pressure 101/83, pulse 73, temperature 98.2 F (36.8 C), temperature source Oral, resp. rate 15, height 5\' 8"  (1.727 m), weight 135 lb (61.236 kg), last menstrual period 08/30/2012, SpO2 98.00%. Physical Exam  Constitutional: She appears well-developed and well-nourished.  HENT:  Mouth/Throat: Oropharynx is clear  and moist.  Eyes: Conjunctivae are normal. No scleral icterus.  Neck: No thyromegaly present.  Cardiovascular: Normal rate, regular rhythm and normal heart sounds.   No murmur heard. Respiratory: Effort normal and breath sounds normal.  GI: Soft. She exhibits no distension and no mass. There is no tenderness.  Musculoskeletal: She exhibits no edema.  Lymphadenopathy:    She has no cervical adenopathy.  Neurological: She is alert.  Skin: Skin is warm and dry.     Assessment/Plan Nausea anorexia and weight loss. Irregular bowel movements. Recent diagnosis of dermatomyositis. Diagnostic EGD and colonoscopy.  Kathryn Austin 01/29/2013, 1:38  PM

## 2013-01-30 LAB — H. PYLORI ANTIBODY, IGG: H Pylori IgG: 0.41 {ISR}

## 2013-01-31 NOTE — Telephone Encounter (Signed)
Done

## 2013-01-31 NOTE — Progress Notes (Signed)
Number on file is not patient  Phone number

## 2013-02-03 ENCOUNTER — Encounter (INDEPENDENT_AMBULATORY_CARE_PROVIDER_SITE_OTHER): Payer: Self-pay | Admitting: *Deleted

## 2013-02-03 ENCOUNTER — Encounter (HOSPITAL_COMMUNITY): Payer: Self-pay | Admitting: Internal Medicine

## 2013-02-06 NOTE — Progress Notes (Signed)
LM for patient to return the call to get a f/u apt scheduled.  

## 2013-02-25 NOTE — Progress Notes (Signed)
LM with husband to have patient return the call.

## 2013-03-04 NOTE — Progress Notes (Signed)
LM to return the call to schedule a f/u apt.

## 2013-03-14 NOTE — Progress Notes (Signed)
LM to return the call at Dr. Olevia Perches office.

## 2013-03-20 ENCOUNTER — Encounter (INDEPENDENT_AMBULATORY_CARE_PROVIDER_SITE_OTHER): Payer: Self-pay | Admitting: *Deleted

## 2013-03-20 NOTE — Progress Notes (Signed)
Kathryn Austin has left several message for the patient to return the call to get an appointment scheduled. Letter mailed on 03/20/13.

## 2013-04-23 IMAGING — CT CT ABD-PELV W/ CM
2 of 3 series · 16 of 46 positions shown, 18 images · IV contrast (Omnipaque 300)
Comparison: None.

CLINICAL DATA: Mid abdominal pain.

CT ABDOMEN AND PELVIS WITH CONTRAST
TECHNIQUE: Multidetector CT imaging of the abdomen and pelvis was
performed following the standard protocol during bolus
administration of intravenous contrast.
Contrast: 100mL OMNIPAQUE IOHEXOL 300 MG/ML IV SOLN

[Series 2: abd_pel_with 5.0 b40f · axial · 0.73mm/px · z∈[-478,-58]mm · 13 of 98 slices shown, 15 images]
[im 7/98  soft-tissue]
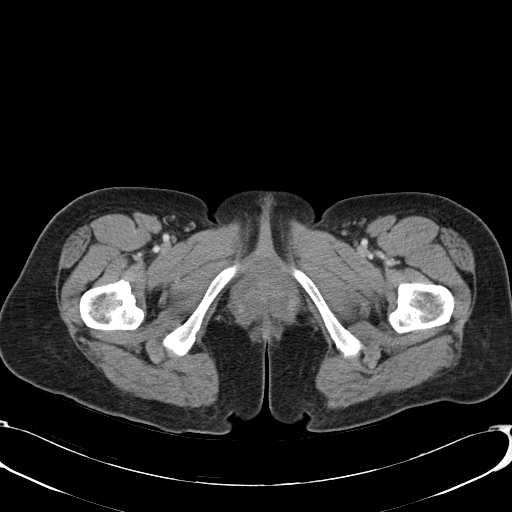
[im 7/98  bone]
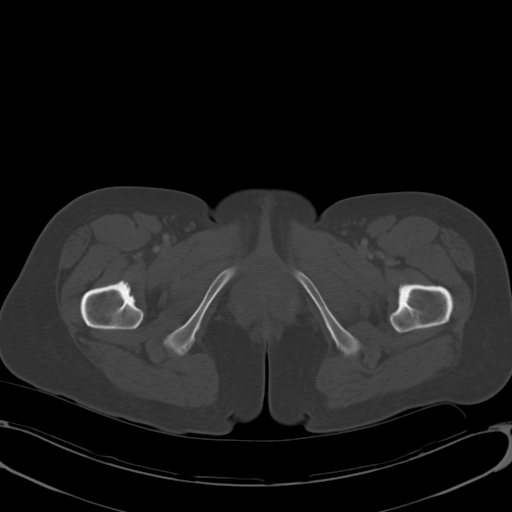
[im 13/98  soft-tissue]
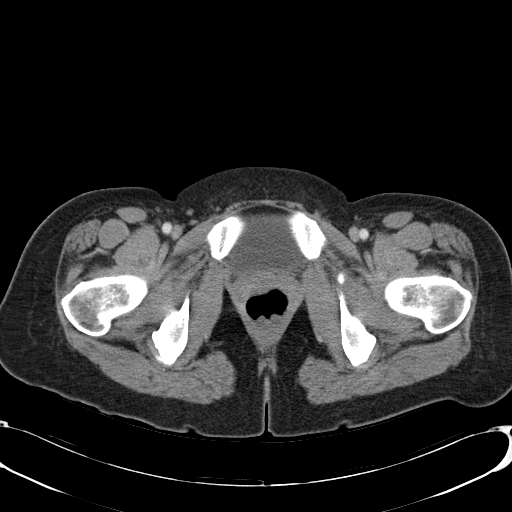
[im 19/98  soft-tissue]
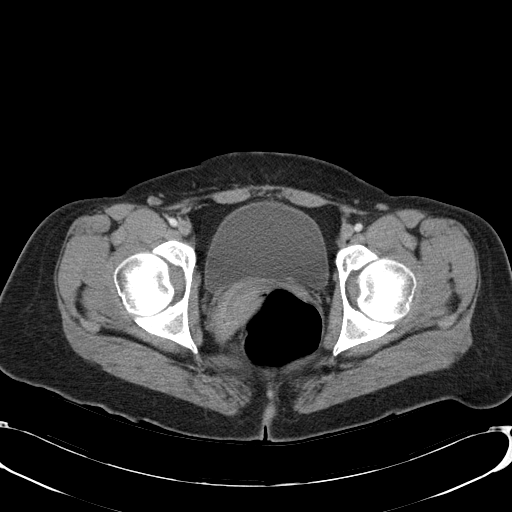
[im 29/98  soft-tissue]
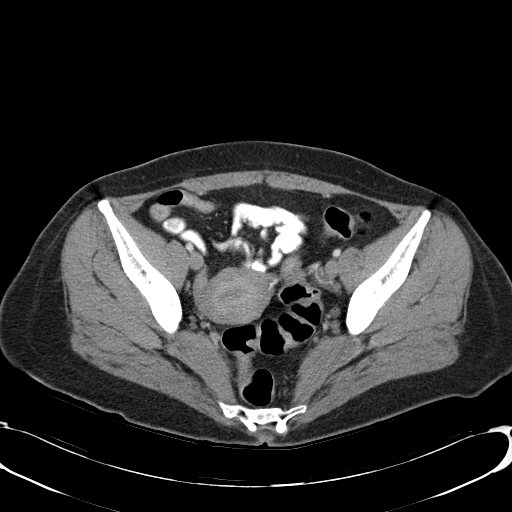
[im 35/98  soft-tissue]
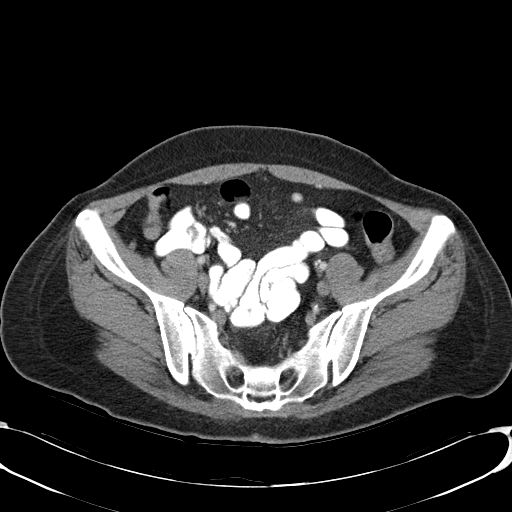
[im 41/98  soft-tissue]
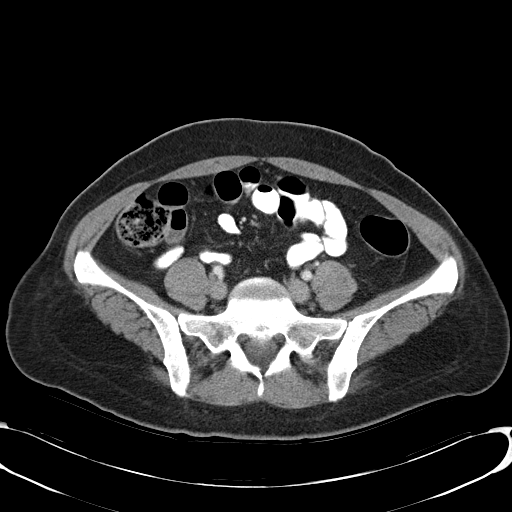
[im 51/98  soft-tissue]
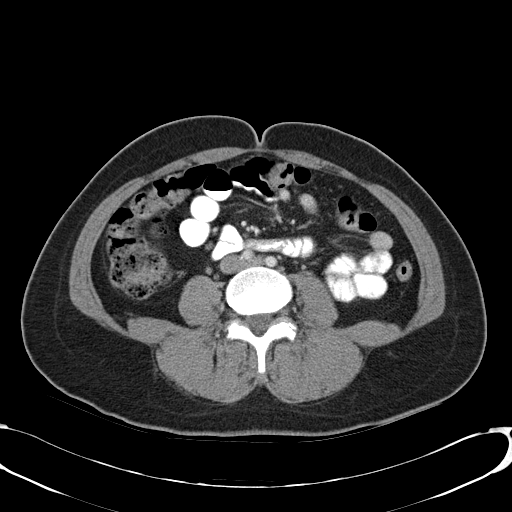
[im 57/98  soft-tissue]
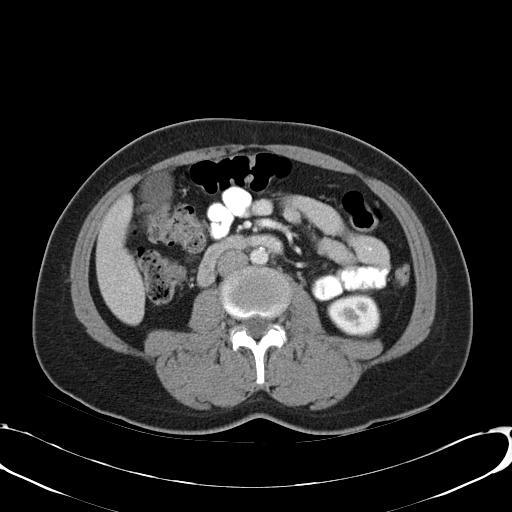
[im 63/98  soft-tissue]
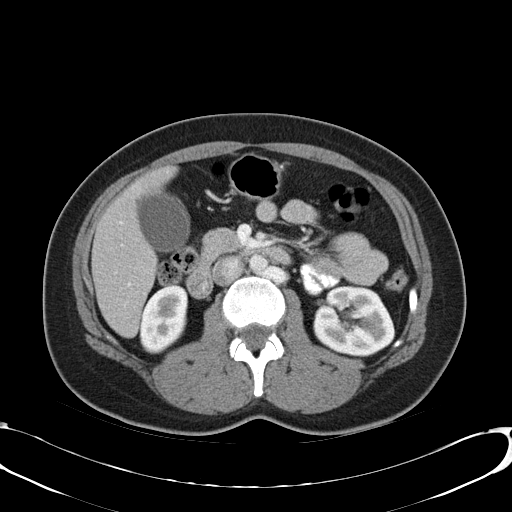
[im 63/98  bone]
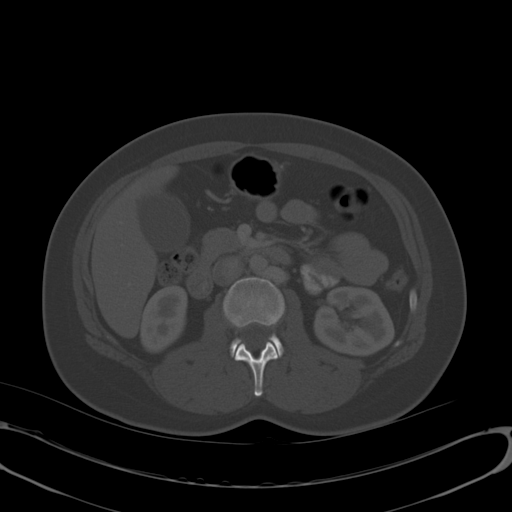
[im 69/98  soft-tissue]
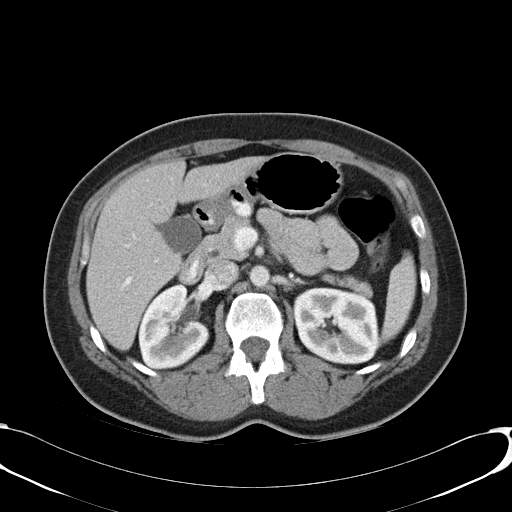
[im 79/98  soft-tissue]
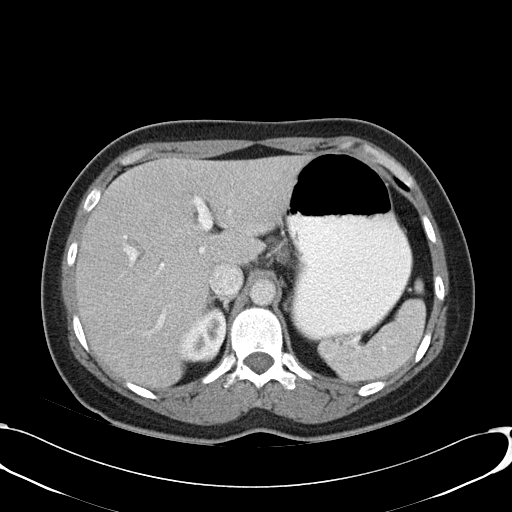
[im 85/98  soft-tissue]
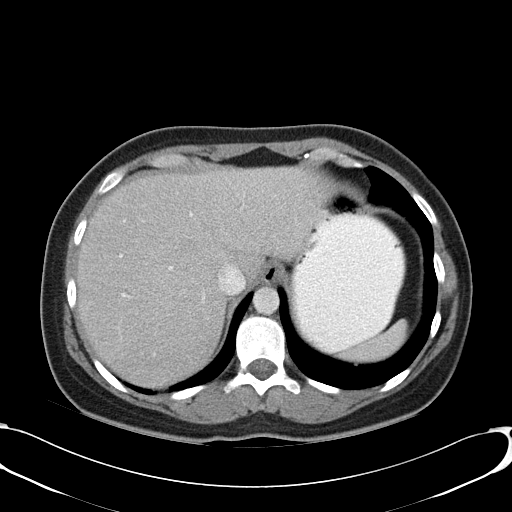
[im 91/98  soft-tissue]
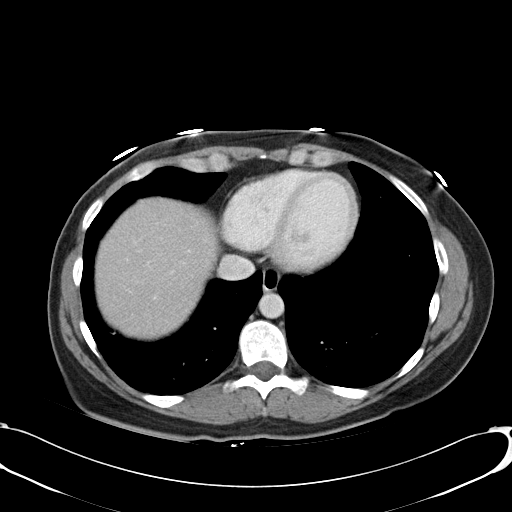

[Series 4: abd_pel_with 3.0 spo cor · coronal · 0.72mm/px · 3 of 75 slices shown]
[im 25/75  soft-tissue]
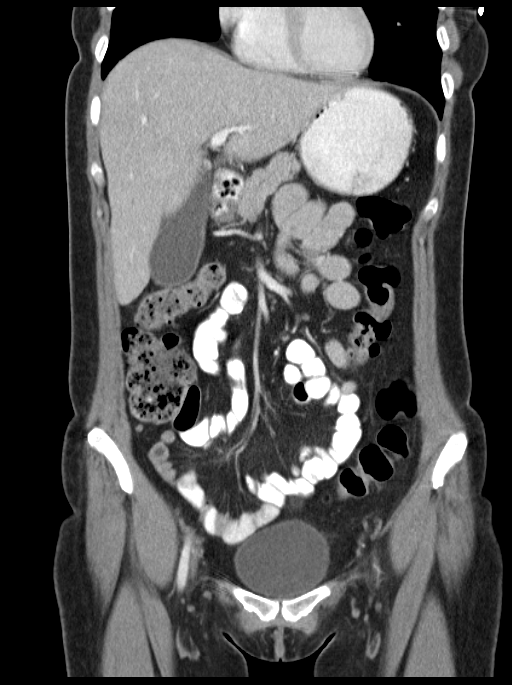
[im 33/75  soft-tissue]
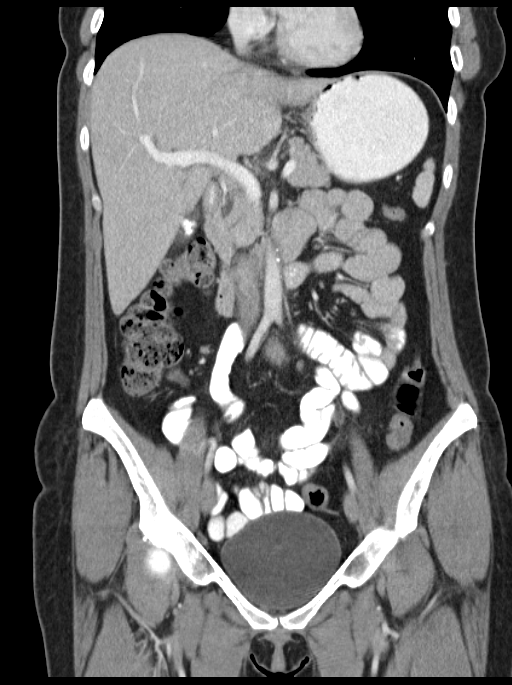
[im 42/75  soft-tissue]
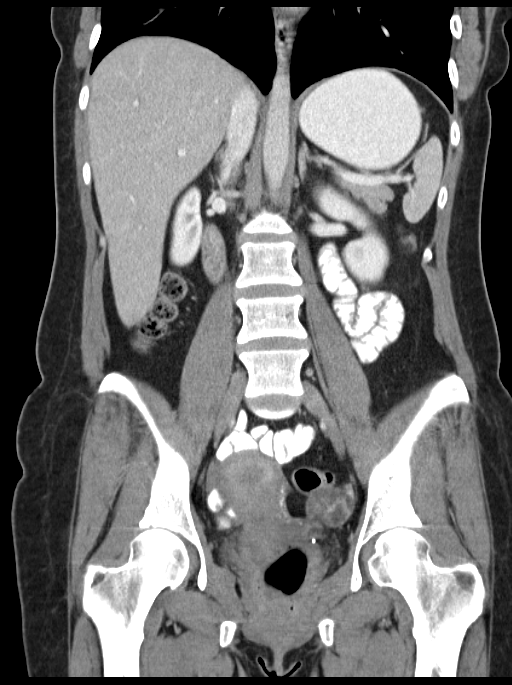

[16 of 46 positions shown; findings below may reference images not displayed]

FINDINGS: The liver, spleen, pancreas, and adrenal glands appear
unremarkable.

A 1.7 cm gallstone is present in the gallbladder.

A tiny simple cyst is noted the right kidney upper pole.

Various vascular calcifications are in the immediate vicinity of
the left distal ureter, but the lack of hydroureter argues against
ureteral calculus.

A 2 cm rim enhancing collapsed complex cystic lesion or corpus
luteum noted in the left ovary.  The right ovary appears normal.  A
small but abnormal amount of free pelvic fluid is present.

Uterine contour appears normal.

The appendix appears normal.

No dilated bowel noted.  Terminal ileum appears unremarkable. The
urinary bladder appears normal.
IMPRESSION: 1.  Small but abnormal amount of free pelvic fluid.  Irregular
marginal enhancing 2 cm structure in the left ovary may represent a
collapsed cyst or corpus luteum.
2.  Single large gallstone noted.

## 2013-07-30 ENCOUNTER — Other Ambulatory Visit (HOSPITAL_COMMUNITY): Payer: Self-pay | Admitting: Physician Assistant

## 2013-07-30 DIAGNOSIS — Z139 Encounter for screening, unspecified: Secondary | ICD-10-CM

## 2013-08-05 ENCOUNTER — Telehealth (HOSPITAL_COMMUNITY): Payer: Self-pay | Admitting: Hematology and Oncology

## 2013-08-05 NOTE — Telephone Encounter (Signed)
Lft vm w/dt and time 08/19/13 @ 2;30

## 2013-08-19 ENCOUNTER — Encounter (HOSPITAL_COMMUNITY): Payer: Medicaid Other | Attending: Hematology and Oncology

## 2013-08-19 ENCOUNTER — Encounter (HOSPITAL_COMMUNITY): Payer: Self-pay

## 2013-08-19 VITALS — BP 118/73 | HR 83 | Temp 97.8°F | Resp 16 | Ht 68.3 in | Wt 146.9 lb

## 2013-08-19 DIAGNOSIS — M3313 Other dermatomyositis without myopathy: Secondary | ICD-10-CM

## 2013-08-19 DIAGNOSIS — K635 Polyp of colon: Secondary | ICD-10-CM | POA: Insufficient documentation

## 2013-08-19 DIAGNOSIS — Z9079 Acquired absence of other genital organ(s): Secondary | ICD-10-CM | POA: Diagnosis not present

## 2013-08-19 DIAGNOSIS — K219 Gastro-esophageal reflux disease without esophagitis: Secondary | ICD-10-CM | POA: Diagnosis not present

## 2013-08-19 DIAGNOSIS — Z79890 Hormone replacement therapy (postmenopausal): Secondary | ICD-10-CM | POA: Insufficient documentation

## 2013-08-19 DIAGNOSIS — F17200 Nicotine dependence, unspecified, uncomplicated: Secondary | ICD-10-CM | POA: Diagnosis not present

## 2013-08-19 DIAGNOSIS — M3390 Dermatopolymyositis, unspecified, organ involvement unspecified: Secondary | ICD-10-CM | POA: Diagnosis present

## 2013-08-19 DIAGNOSIS — Z90710 Acquired absence of both cervix and uterus: Secondary | ICD-10-CM | POA: Diagnosis not present

## 2013-08-19 LAB — CBC WITH DIFFERENTIAL/PLATELET
BASOS PCT: 0 % (ref 0–1)
Basophils Absolute: 0 10*3/uL (ref 0.0–0.1)
EOS ABS: 0.1 10*3/uL (ref 0.0–0.7)
Eosinophils Relative: 1 % (ref 0–5)
HCT: 40.4 % (ref 36.0–46.0)
Hemoglobin: 14.2 g/dL (ref 12.0–15.0)
Lymphocytes Relative: 21 % (ref 12–46)
Lymphs Abs: 2.2 10*3/uL (ref 0.7–4.0)
MCH: 32.4 pg (ref 26.0–34.0)
MCHC: 35.1 g/dL (ref 30.0–36.0)
MCV: 92.2 fL (ref 78.0–100.0)
MONOS PCT: 3 % (ref 3–12)
Monocytes Absolute: 0.3 10*3/uL (ref 0.1–1.0)
NEUTROS PCT: 75 % (ref 43–77)
Neutro Abs: 7.7 10*3/uL (ref 1.7–7.7)
PLATELETS: 279 10*3/uL (ref 150–400)
RBC: 4.38 MIL/uL (ref 3.87–5.11)
RDW: 13.3 % (ref 11.5–15.5)
WBC: 10.3 10*3/uL (ref 4.0–10.5)

## 2013-08-19 LAB — URINE MICROSCOPIC-ADD ON

## 2013-08-19 LAB — COMPREHENSIVE METABOLIC PANEL
ALK PHOS: 84 U/L (ref 39–117)
ALT: 15 U/L (ref 0–35)
AST: 17 U/L (ref 0–37)
Albumin: 3.9 g/dL (ref 3.5–5.2)
BILIRUBIN TOTAL: 0.3 mg/dL (ref 0.3–1.2)
BUN: 8 mg/dL (ref 6–23)
CHLORIDE: 105 meq/L (ref 96–112)
CO2: 25 mEq/L (ref 19–32)
Calcium: 9.2 mg/dL (ref 8.4–10.5)
Creatinine, Ser: 0.7 mg/dL (ref 0.50–1.10)
GFR calc Af Amer: >90 mL/min (ref >90)
GFR calc non Af Amer: >90 mL/min (ref >90)
Glucose, Bld: 83 mg/dL (ref 70–99)
POTASSIUM: 4.3 meq/L (ref 3.7–5.3)
Sodium: 142 mEq/L (ref 137–147)
Total Protein: 7.2 g/dL (ref 6.0–8.3)

## 2013-08-19 LAB — URINALYSIS, ROUTINE W REFLEX MICROSCOPIC
Bilirubin Urine: NEGATIVE
GLUCOSE, UA: NEGATIVE mg/dL
Ketones, ur: NEGATIVE mg/dL
Leukocytes, UA: NEGATIVE
Nitrite: NEGATIVE
PH: 6 (ref 5.0–8.0)
Protein, ur: NEGATIVE mg/dL
UROBILINOGEN UA: 0.2 mg/dL (ref 0.0–1.0)

## 2013-08-19 LAB — C-REACTIVE PROTEIN: CRP: 0.8 mg/dL — AB (ref <0.60)

## 2013-08-19 LAB — RETICULOCYTES
RBC.: 4.38 MIL/uL (ref 3.87–5.11)
RETIC CT PCT: 1.3 % (ref 0.4–3.1)
Retic Count, Absolute: 56.9 10*3/uL (ref 19.0–186.0)

## 2013-08-19 LAB — LACTATE DEHYDROGENASE: LDH: 222 U/L (ref 94–250)

## 2013-08-19 NOTE — Patient Instructions (Signed)
[place] Discharge Instructions  RECOMMENDATIONS MADE BY THE CONSULTANT AND ANY TEST RESULTS WILL BE SENT TO YOUR REFERRING PHYSICIAN.  EXAM FINDINGS BY THE PHYSICIAN TODAY AND SIGNS OR SYMPTOMS TO REPORT TO CLINIC OR PRIMARY PHYSICIAN:  You saw Dr Barnet Glasgow today  Scheduled  For CT scan 08/26/13.  Consult with urology.  Follow up with doctor in 3 weeks  Thank you for choosing [facility] to provide your oncology and hematology care.  To afford each patient quality time with our providers, please arrive at least 15 minutes before your scheduled appointment time.  With your help, our goal is to use those 15 minutes to complete the necessary work-up to ensure our physicians have the information they need to help with your evaluation and healthcare recommendations.    Effective January 1st, 2014, we ask that you re-schedule your appointment with our physicians should you arrive 10 or more minutes late for your appointment.  We strive to give you quality time with our providers, and arriving late affects you and other patients whose appointments are after yours.    Again, thank you for choosing [facility].  Our hope is that these requests will decrease the amount of time that you wait before being seen by our physicians.       _____________________________________________________________  Should you have questions after your visit to [facility], please contact our office at [phone] between the hours of 8:30 a.m. and 5:00 p.m.  Voicemails left after 4:30 p.m. will not be returned until the following business day.  For prescription refill requests, have your pharmacy contact our office with your prescription refill request.

## 2013-08-19 NOTE — Progress Notes (Signed)
Kathryn Austin's reason for visit today is for labs as scheduled per MD orders.  Venipuncture performed with a 23 gauge butterfly needle to [place] tolerated procedure well and without incident; questions were answered and patient was discharged.

## 2013-08-19 NOTE — Progress Notes (Signed)
Blyn A. Barnet Glasgow, M.D.  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    HISTORY OF McNary is a 38 y.o. female who is referred for recommendations regarding appropriate screening for malignancy an individual with dermatomyositis. Her primary manifestation was with a skin rash on the anterior chest and also above the knees bilaterally. Biopsy was performed of the lower extremity at which time the diagnosis was established. The patient had intermittent problems with swallowing as well as found to have reflux esophagitis. There was no evidence of change suggesting premalignancy. There is no evidence of H. pylori infection. The patient has undergone TAH/BSO and is currently on Estrace replacement therapy. Appetite is good with no nausea, vomiting, diarrhea, constipation, dysuria, hematuria, lower 70 swelling or redness, skin rash worsening, headache, or seizures.   PAST MEDICAL HISTORY:  has a past medical history of Anxiety; Hypertension; GERD (gastroesophageal reflux disease); Active smoker (2014); and Neuromuscular disorder.      Patient Information    Patient Name Sex DOB SSN   Kathryn, Austin Female [birth year] [id number]            Op Note by Rogene Houston, MD at 01/29/2013 2:37 PM    Author: Rogene Houston, MD Service: Endoscopy Author Type: Physician   Filed: 01/29/2013 2:44 PM Note Time: 01/29/2013 2:37 PM Status: Signed   Editor: Rogene Houston, MD (Physician)      EGD PROCEDURE REPORT  PATIENT: Kathryn Austin MR#: [id number]  Birthdate: 19-Dec-1975, 38 y.o., female    Endoscopist: Dr. Rogene Houston, MD Procedure Date: 01/29/2013  Referred By: Collene Mares, [facility]  Procedure: EGD & Colonoscopy  Indications: Patient is 38 year old Caucasian female who presents with recurrent nausea anorexia weight loss and irregular bowel movements. Lately she has been constipated. She denies melena or rectal bleeding. 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  She is undergoing diagnostic EGD and colonoscopy.  Informed Consent: The risks, benefits, alternatives & imponderables which include, but are not limited to, bleeding, infection, perforation, drug reaction and potential missed lesion have been reviewed. The potential for biopsy, lesion removal, esophageal dilation, etc. have also been discussed. Questions have been answered. All parties agreeable. Please see history & physical in medical record for more information.  Medications:  Demerol 50 mg IV  Versed 8 mg IV  Promethazine 12.5 mg IV and diluted form.  Cetacaine spray topically for oropharyngeal anesthesia  EGD  Description of procedure: The endoscope was introduced through the mouth and advanced to the second portion of the duodenum without difficulty or limitations. The mucosal surfaces were surveyed very carefully during advancement of the scope and upon withdrawal.  Findings:  Esophagus: Mucosa of the esophagus was normal. Focal erythema and edema noted at GE junction. No ring or stricture present.  GEJ: 40 cm  Stomach: Stomach was empty and distended very well with insufflation. Folds in the proximal stomach are normal. Examination mucosa and body was normal. Patchy erythema and granularity noted at antrum. No erosions or ulcer noted. Pyloric channel was patent. Angularis, fundus and cardia were examined by retroflexion of the scope  and were unremarkable.  Duodenum: Normal bulbar and post bulbar mucosa.  Therapeutic/Diagnostic Maneuvers Performed: None   COLONOSCOPY  Description of procedure: After a digital rectal exam was performed, that colonoscope was advanced from the anus through the rectum and colon to the area of the cecum, ileocecal valve and appendiceal orifice. The cecum was deeply intubated. These structures were well-seen and photographed for the record. From the level of the cecum and ileocecal valve, the scope was slowly and cautiously withdrawn. The mucosal surfaces were carefully surveyed utilizing scope tip to flexion to facilitate fold flattening as needed. The scope was pulled down into the rectum where a thorough exam including retroflexion was performed.  Findings:  Prep satisfactory.  Normal mucosa of colon.  4 mm rectal polyp ablated while cold biopsy.  Small hemorrhoids below the dentate line.  Therapeutic/Diagnostic Maneuvers Performed: See above  Complications: None  Cecal Withdrawal Time: 14 minutes  Impression:  EGD findings;  Mild changes of reflux esophagitis limited to GE junction.  Nonerosive antral gastritis.  Colonoscopy findings;  Examination performed to cecum.  4 mm rectal polyp ablated via cold biopsy.  Small external hemorrhoids.  Recommendations:  Dexilant 60 mg by mouth every morning(patient to pick up samples from the office).  H. pylori serology.  High fiber diet.  I will contact patient with results of biopsy and blood test and further recommendations.  Kathryn,NAJEEB Austin 01/29/2013 2:37 PM  CC: Dr. Collene Mares, Marland Kitchen, PA-C & Dr. Rayne Du      PAST SURGICAL HISTORY: Past Surgical History  Procedure Laterality Date  . Cesarean section      x2  . Tonsillectomy    . Dilation and curettage of uterus    . Tubal ligation    . Cholecystectomy  03/31/2011    Procedure: LAPAROSCOPIC CHOLECYSTECTOMY;  Surgeon: Jamesetta So, MD;  Location: [facility];  Service: General;  Laterality: N/A;  . Abdominal hysterectomy N/A 09/04/2012    Procedure: HYSTERECTOMY ABDOMINAL;  Surgeon: Florian Buff, MD;  Location: [facility];   Service: Gynecology;  Laterality: N/A;  . Salpingoophorectomy Bilateral 09/04/2012    Procedure: BILATERAL SALPINGO OOPHORECTOMY;  Surgeon: Florian Buff, MD;  Location: [facility];  Service: Gynecology;  Laterality: Bilateral;  . Colonoscopy with esophagogastroduodenoscopy (egd) N/A 01/29/2013    Procedure: COLONOSCOPY WITH ESOPHAGOGASTRODUODENOSCOPY (EGD);  Surgeon: Rogene Houston, MD;  Location: [facility];  Service: Endoscopy;  Laterality: N/A;  1200-moved to 1250 Ann to notify pt     CURRENT MEDICATIONS: has a current medication list which includes the following prescription(s): alprazolam, estradiol, folic acid, hydrocodone-acetaminophen, hydroxychloroquine, and methotrexate.   ALLERGIES: Review of patient's allergies indicates no known allergies.   SOCIAL HISTORY:  reports that she has been smoking Cigarettes.  She has a 6.25 pack-year smoking history. She has never used smokeless tobacco. She reports that she does not drink alcohol or use illicit drugs.   FAMILY HISTORY: family history includes COPD in her mother; Cancer in her father; Diabetes in her mother; Heart disease in her mother; Hypertension in her mother; Stroke in her mother. There is no history of Anesthesia problems, Hypotension, Malignant hyperthermia, or Pseudochol deficiency.    REVIEW OF SYSTEMS:  Other than that discussed above is noncontributory.    PHYSICAL EXAM:  height is 5' 8.3" (1.735 m) and weight is 146 lb 14.4 oz (66.633 kg). Her oral temperature is 97.8 F (36.6 C). Her blood pressure is 118/73 and her pulse is 83. Her respiration is 16.  GENERAL:alert, no distress and comfortable SKIN: skin color, texture, turgor are normal, no rashes or significant lesions. No evidence of significant induration. EYES: normal, Conjunctiva are pink and non-injected, sclera clear OROPHARYNX:no exudate, no erythema and lips, buccal mucosa, and tongue normal  NECK: supple, thyroid normal size, non-tender, without  nodularity CHEST: Normal AP diameter with no breast masses. LYMPH:  no palpable lymphadenopathy in the cervical, axillary or inguinal LUNGS: clear to auscultation and percussion with normal breathing effort HEART: regular rate & rhythm and no murmurs ABDOMEN:abdomen soft, non-tender and normal bowel sounds MUSCULOSKELETALl:no cyanosis of digits, no clubbing or edema  NEURO: alert & oriented x 3 with fluent speech, no focal motor/sensory deficits    LABORATORY DATA:  No visits with results within 30 Day(s) from this visit. Latest known visit with results is:  Admission on 01/29/2013, Discharged on 01/29/2013  Component Date Value Ref Range Status  . H Pylori IgG 01/29/2013 0.41   Final   Comment: (NOTE)                          No significant level of IgG antibody to H. pylori detected.                            ISR = Immune Status Ratio                                      <0.90         ISR       Negative                                      0.90 - 1.09   ISR       Equivocal                                      >=1.10        ISR       Positive                          The above results were obtained with the Immulite 2000 H. pylori IgG                          EIA.  Results obtained from other manufacturer's assay methods may not                          be used interchangeably.                          Performed at [facility]    Urinalysis    Component Value Date/Time   COLORURINE YELLOW 08/29/2012 [place] 08/29/2012 1252   LABSPEC 1.010 08/29/2012 1252   PHURINE 5.5 08/29/2012 1252   GLUCOSEU NEGATIVE 08/29/2012 1252   HGBUR SMALL* 08/29/2012 [place] 08/29/2012 [place] 08/29/2012 1252   PROTEINUR neg 10/09/2012 1442   PROTEINUR NEGATIVE 08/29/2012 1252   UROBILINOGEN 0.2 08/29/2012 1252  NITRITE neg 10/09/2012 1442   NITRITE NEGATIVE 08/29/2012 1252   LEUKOCYTESUR Negative 10/09/2012 1442      @RADIOGRAPHY : US Transvaginal  Non-OB Status: Final result            Study Result    GYNECOLOGIC SONOGRAM  Kathryn Austin is a 38 y.o. [id number] LMP-08/11/2012 for a pelvic sonogram for dyspareunia, irregular cycles.  Uterus 8.1 x 4.8 x 3.8 cm, anteverted, multiple small hyperechoic foci noted within myometrium  Endometrium 4.6 mm, symmetrical,  Right ovary 1.7 x 1.1 x 0.6 cm, Pt states no surgery to Rt ovary (size noted to be smaller than LT)  Left ovary 3.2 x 2.7 x 2.5 cm, Appears WNL  Small Nabothian Cyst noted in cervix=4mm  Technician Comments:  No free fluid or adnexal masses noted  Lazarus Gowda  08/27/2012  1:32 PM  Clinical Impression and recommendations:  I have reviewed the sonogram results above, combined with the patient's current clinical course, below are my impressions and any appropriate recommendations for management based on the sonographic findings.  1. Normal pelvic sonogram  2. No finding to explain patient' clinical symptoms  EURE,LUTHER H  08/27/2012  2:05 PM     PATHOLOGY:  FINAL for Kathryn Austin, Kathryn Austin ([id number]) Patient: Kathryn Austin, Kathryn Austin Collected: 99/37/1696 Client: [facility] Accession: [id number] Received: 09/04/2012 Tania Ade DOB: 1975/04/21 Age: 86 Gender: F Reported: 09/05/2012 [address] Patient Ph: [phone] MRN #: [id number] Linna Hoff [place] [address] Visit #: [id number] Chart #: Phone: [phone] Fax: CC: REPORT OF SURGICAL PATHOLOGY FINAL DIAGNOSIS Diagnosis Uterus, ovaries and fallopian tubes - BENIGN PROLIFERATIVE PHASE ENDOMETRIUM; NEGATIVE FOR HYPERPLASIA OR MALIGNANCY. - UNREMARKABLE UTERINE SEROSA. - BENIGN CERVICAL MUCOSA; NEGATIVE FOR INTRAEPITHELIAL LESION OR MALIGNANCY. - BENIGN RIGHT AND LEFT FALLOPIAN TUBE WITH SEROUS TYPE PARATUBAL CYST; NEGATIVE FOR ATYPIA OR MALIGNANCY. - BENIGN RIGHT OVARY; NEGATIVE FOR ATYPIA OR MALIGNANCY. - BENIGN LEFT OVARY WITH FOLLICULAR TYPE CYSTS; NEGATIVE FOR ATYPIA OR MALIGNANCY. [place] RUND  DO Pathologist, Electronic Signature (Case signed 09/05/2012) Specimen Gross and Clinical Information Specimen(s) Obtained: Uterus, ovaries and fallopian tubes Specimen Clinical Information dysfunctional uterine bleeding, dyspareunia Gross Specimen: Received in formalin labeled uterus and cervix, bilateral fallopian tubes and ovaries. Specimen integrity (intact/incised/disrupted): Intact uterus and cervix with attached right adnexa and separately received left adnexa. Weight: 75 grams, excluding adnexa. Serosa: Asymmetrical uterus measuring 8.6 cm fundus to cervix x 5.0 cm cornu to cornu x 3.5 cm anterior to posterior. Cervix: Tan pink and smooth, with a 1.7 cm linear disruption on the posterior aspect (possible surgical artifact). The ectocervix measures 3.8 x 3.7 cm with a 1.6 cm slit-like os. The external surface is tan pink smooth and glistening. The endocervical canal measures 2.8 cm in length and is tan pink and corrugated. The cervical stroma is tan white and fibrous with multiple Nabothian cysts. Endometrium: The triangular endometrial cavity measures 3.0 x 2.2 cm, is tan pink and focally hemorrhagic. The endometrium measures 0.1 cm in thickness. Myometrium: The myometrium is tan pink and trabeculated and no lesions are grossly identified. The 1 of 2 FINAL for Kathryn Austin, Kathryn Austin ([id number]) Gross(continued) myometrium measures up to 1.4 cm in thickness. Right adnexa: The right fimbriated fallopian tube measures 3.8 cm in length x 0.4 cm in diameter. The serosal surface is tan brown and smooth with two clear serous fluid filled paratubal cysts measuring 0.5 and 0.8 cm in greatest dimension. Sectioning the fallopian tube reveals a tan white cut surface with a pinpoint lumen. The adjacent  possible right ovary measures 1.5 x 1.2 x 0.6 cm. The external surface is tan pink and smooth. Sectioning reveals a tan white cut surface. Left adnexa: The left fimbriated fallopian tube  measures 4.5 cm in length x 0.5 cm in diameter. The serosal surface is tan pink and smooth. The adjacent left ovary measures 4.2 x 3.5 x 2.3 cm and weighs 11 grams. The external surface is tan pink and cerebriform with multiple cortical cystic structures. Sectioning reveals a tan pink fibrous cut surface with multiple smooth walled cysts filled with clear serous fluid measuring up to 1.5 cm in greatest dimension, and multiple tan yellow hemorrhagic corpus luteum. Block Summary: A = anterior cervix B = posterior cervix C and D = anterior full thickness endomyometrium E and F = posterior full thickness endomyometrium G = cross sections of fimbriated end of right fallopian tube H = possible right ovary I = cross sections of fimbriated end of left fallopian tube J and K = left ovary (KL:caf 09/04/12) Report signed out from the following location(s) Technical Component and Interpretation performed at [email or web address], [place], [place] [address].    FINAL for Kathryn Austin, Kathryn Austin ([id number]) Patient: Kathryn Austin, Kathryn Austin Collected: 38/93/7342 Client: [facility] Accession: [id number] Received: 01/30/2013 Hildred Laser DOB: 10/12/1975 Age: 1 Gender: F Reported: 01/31/2013 [address] Patient Ph: [phone] MRN #: [id number] Linna Hoff [place] [address] Visit #: [id number] Chart #: Phone: [phone] Fax: CC: REPORT OF SURGICAL PATHOLOGY FINAL DIAGNOSIS Diagnosis Rectum, polyp(s) - TUBULAR ADENOMA - HIGH GRADE DYSPLASIA IS NOT IDENTIFIED. Enid Cutter MD Pathologist, Electronic Signature (Case signed 01/31/2013) Specimen Gross and Clinical Information Specimen(s) Obtained: Rectum, polyp(s) Specimen Clinical Information Pre-op: constipation; GERD; Post-op: mild changes associated with reflux; patchy gastric redness; GEJ @ 40 cm; COLON: hemorrhoids, rectal polyp Gross Received in formalin are tan, soft tissue fragments that are submitted in toto. Number:  two, Size: 0.3 and 0.4 cm. (Aggregate measurement: 0.5 x0.4 x 0.2 cm) (GP:gt, 01/30/13) Report signed out from the following location(s) Technical Component performed at [facility],[place] [address].[id number]., Interpretation performed at [place], [place], [place] [address]. 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 #2. Increase risk of malignancies 70% of which are caused by cancer the pancreas, cervix, ovary, stomach, colon, and bladder. #3. Gastroesophageal reflux disease   PLAN:  #1. CT chest abdomen and pelvis with contrast as a baseline. #2. Urology consult for cystoscopy and NMP22  Bladder Check testing. #3. Lab tests to include CBC, chem profile, CA 125, CEA, CA 19-9, and C-reactive protein. #4.Urinalysis and urine cytology. #5. Followup in 3 weeks. #6. Stop smoking!  I appreciate the opportunity of sharing in her care.   Doroteo Bradford, MD 08/19/2013 4:09 PM   DISCLAIMER:  This note was dictated with voice recognition softwre.  Similar sounding words can inadvertently be transcribed inaccurately and may not be corrected upon review.

## 2013-08-20 LAB — DIRECT ANTIGLOBULIN TEST (NOT AT ARMC)
DAT, IgG: NEGATIVE
DAT, complement: NEGATIVE

## 2013-08-20 LAB — CEA: CEA: 3.1 ng/mL (ref 0.0–5.0)

## 2013-08-20 LAB — CA 125: CA 125: 5.2 U/mL (ref 0.0–30.2)

## 2013-08-20 LAB — CANCER ANTIGEN 19-9: CA 19-9: 6 U/mL — ABNORMAL LOW (ref <35.0)

## 2013-08-26 ENCOUNTER — Ambulatory Visit (HOSPITAL_COMMUNITY): Payer: Medicaid Other

## 2013-08-26 ENCOUNTER — Telehealth (HOSPITAL_COMMUNITY): Payer: Self-pay | Admitting: *Deleted

## 2013-08-26 ENCOUNTER — Encounter (HOSPITAL_COMMUNITY): Payer: Self-pay

## 2013-08-26 ENCOUNTER — Ambulatory Visit (HOSPITAL_COMMUNITY)
Admission: RE | Admit: 2013-08-26 | Discharge: 2013-08-26 | Disposition: A | Discharge status: Home / Self Care | Payer: Medicaid Other | Source: Ambulatory Visit | Attending: Hematology and Oncology | Admitting: Hematology and Oncology

## 2013-08-26 DIAGNOSIS — Z129 Encounter for screening for malignant neoplasm, site unspecified: Secondary | ICD-10-CM | POA: Insufficient documentation

## 2013-08-26 DIAGNOSIS — M3313 Other dermatomyositis without myopathy: Secondary | ICD-10-CM | POA: Insufficient documentation

## 2013-08-26 DIAGNOSIS — I708 Atherosclerosis of other arteries: Secondary | ICD-10-CM | POA: Insufficient documentation

## 2013-08-26 DIAGNOSIS — I700 Atherosclerosis of aorta: Secondary | ICD-10-CM | POA: Insufficient documentation

## 2013-08-26 DIAGNOSIS — M3390 Dermatopolymyositis, unspecified, organ involvement unspecified: Secondary | ICD-10-CM

## 2013-08-26 MED ORDER — IOHEXOL 300 MG/ML  SOLN
100.0000 mL | Freq: Once | INTRAMUSCULAR | Status: AC | PRN
  Administered 2013-08-26: 100 mL via INTRAVENOUS

## 2013-08-26 NOTE — Telephone Encounter (Signed)
Message left with patient's husband that scans are normal.

## 2013-09-08 ENCOUNTER — Ambulatory Visit (HOSPITAL_COMMUNITY)
Admission: RE | Admit: 2013-09-08 | Discharge: 2013-09-08 | Disposition: A | Discharge status: Home / Self Care | Payer: Medicaid Other | Source: Ambulatory Visit | Attending: Physician Assistant | Admitting: Physician Assistant

## 2013-09-08 ENCOUNTER — Ambulatory Visit (HOSPITAL_COMMUNITY): Payer: Medicaid Other

## 2013-09-08 DIAGNOSIS — Z1231 Encounter for screening mammogram for malignant neoplasm of breast: Secondary | ICD-10-CM | POA: Diagnosis present

## 2013-09-08 DIAGNOSIS — Z139 Encounter for screening, unspecified: Secondary | ICD-10-CM

## 2013-09-09 ENCOUNTER — Ambulatory Visit (HOSPITAL_COMMUNITY): Payer: Medicaid Other

## 2013-09-19 ENCOUNTER — Ambulatory Visit (HOSPITAL_COMMUNITY): Payer: Medicaid Other

## 2013-09-19 ENCOUNTER — Other Ambulatory Visit (HOSPITAL_COMMUNITY): Payer: Self-pay | Admitting: Oncology

## 2013-09-19 DIAGNOSIS — M3390 Dermatopolymyositis, unspecified, organ involvement unspecified: Secondary | ICD-10-CM

## 2013-09-22 ENCOUNTER — Encounter (HOSPITAL_COMMUNITY): Payer: Self-pay | Admitting: Lab

## 2013-09-22 ENCOUNTER — Encounter (HOSPITAL_COMMUNITY): Payer: Medicaid Other | Attending: Hematology and Oncology

## 2013-09-22 ENCOUNTER — Encounter (HOSPITAL_COMMUNITY): Payer: Self-pay

## 2013-09-22 VITALS — BP 120/73 | HR 69 | Temp 98.3°F | Resp 16 | Wt 146.0 lb

## 2013-09-22 DIAGNOSIS — K219 Gastro-esophageal reflux disease without esophagitis: Secondary | ICD-10-CM | POA: Insufficient documentation

## 2013-09-22 DIAGNOSIS — M3313 Other dermatomyositis without myopathy: Secondary | ICD-10-CM | POA: Insufficient documentation

## 2013-09-22 DIAGNOSIS — Z9079 Acquired absence of other genital organ(s): Secondary | ICD-10-CM | POA: Insufficient documentation

## 2013-09-22 DIAGNOSIS — Z79890 Hormone replacement therapy (postmenopausal): Secondary | ICD-10-CM | POA: Insufficient documentation

## 2013-09-22 DIAGNOSIS — M3390 Dermatopolymyositis, unspecified, organ involvement unspecified: Secondary | ICD-10-CM | POA: Insufficient documentation

## 2013-09-22 DIAGNOSIS — F17200 Nicotine dependence, unspecified, uncomplicated: Secondary | ICD-10-CM | POA: Insufficient documentation

## 2013-09-22 DIAGNOSIS — Z90710 Acquired absence of both cervix and uterus: Secondary | ICD-10-CM | POA: Insufficient documentation

## 2013-09-22 NOTE — Progress Notes (Signed)
[facility] appointment made for 9/1 at 3:45 with Dr Diona Fanti in the [place] office

## 2013-09-22 NOTE — Progress Notes (Signed)
[facility] OFFICE PROGRESS NOTE  PCP Collene Mares, PA-C [place] [address]  DIAGNOSIS: Dermatomyositis  CURRENT THERAPY: Placquenil and methotrexate with Dermatologist    INTERVAL HEMATOLOGY/ONCOLOGY HX: [name] is a 38 y.o. woman who had a skin biopsy last year after presenting with skin rash, fatigue, and  Intermittent swallowing problems. Endoscopies were performed in December 2014 and a TAH with BSO in July 22014. There were suspicious findings for malignancy. [name] was referred for recommendations regarding appropriate screening for malignancy an individual with dermatomyositis. Scans and blood tests were performed and she is returning to discuss the results. There have been no new concerns since her initial visit last month. .  MEDICAL HISTORY:  Past Medical History  Diagnosis Date  . Anxiety   . Hypertension   . GERD (gastroesophageal reflux disease)   . Active smoker 2014  . Neuromuscular disorder     dramata myocytis    has Dermatomyositis; Dyspareunia; Dysmenorrhea; Menometrorrhagia; Colon polyp, tubular; and Gastroesophageal reflux disease on her problem list.    ALLERGIES:  has No Known Allergies.  MEDICATIONS: has a current medication list which includes the following prescription(s): alprazolam, estradiol, folic acid, hydrocodone-acetaminophen, hydroxychloroquine, and methotrexate.  FAMILY HISTORY: family history includes COPD in her mother; Cancer in her father; Diabetes in her mother; Heart disease in her mother; Hypertension in her mother; Stroke in her mother. There is no history of Anesthesia problems, Hypotension, Malignant hyperthermia, or Pseudochol deficiency.  REVIEW OF SYSTEMS:    SINCE YOUR LAST VISIT Been diagnosed or treated for a new medical /surgical  problem or condition: No Any Recent Xrays or studies performed: No Any new prescription or OTC medications: No ECOG Perf  Status: Fully active, able to carry on all pre-disease performance without restriction Problems sleeping: Yes (awakens and has difficulty going back to sleep) Medications taken to help sleep: Yes (xanax at times) How is your appetie: 100% normal Any Supplements: Yes (ensure occasionally) Any trouble chewing or swallowing: No Any Nausea or Vomiting: Yes (nausea with methotrexate) Any Bowel problems: No # Bowel Movements per week: 4 Any Urinary Issues: Yes (goes often and has hard time emptying) Any Cardiac Problems: No Any Respiratory Issues: No Any Neurological Issues:  (headaches more frequently) Do you live alone: No Feelings hopelessness: No You or your family have any concerns or Health changes: No Pain Assessment Pain Score: 0-No pain  Other than that discussed above is noncontributory.    PHYSICAL EXAMINATION:   weight is 146 lb (66.225 kg). Her oral temperature is 98.3 F (36.8 C). Her blood pressure is 120/73 and her pulse is 69. Her respiration is 16.    GENERAL: alert, no distress and comfortable  Examination performed on August 19, 2013 and not repeated.  LABORATORY DATA: No visits with results within 30 Day(s) from this visit. Latest known visit with results is:  Office Visit on 08/19/2013  Component Date Value Ref Range Status  . WBC 08/19/2013 10.3  4.0 - 10.5 K/uL Final  . RBC 08/19/2013 4.38  3.87 - 5.11 MIL/uL Final  . Hemoglobin 08/19/2013 14.2  12.0 - 15.0 g/dL Final  . HCT 08/19/2013 40.4  36.0 - 46.0 % Final  . MCV 08/19/2013 92.2  78.0 - 100.0 fL Final  . MCH 08/19/2013 32.4  26.0 - 34.0 pg Final  . MCHC 08/19/2013 35.1  30.0 - 36.0 g/dL Final  . RDW 08/19/2013 13.3  11.5 - 15.5 % Final  .  Platelets 08/19/2013 279  150 - 400 K/uL Final  . Neutrophils Relative % 08/19/2013 75  43 - 77 % Final  . Neutro Abs 08/19/2013 7.7  1.7 - 7.7 K/uL Final  . Lymphocytes Relative 08/19/2013 21  12 - 46 % Final  . Lymphs Abs 08/19/2013 2.2  0.7 - 4.0 K/uL Final  .  Monocytes Relative 08/19/2013 3  3 - 12 % Final  . Monocytes Absolute 08/19/2013 0.3  0.1 - 1.0 K/uL Final  . Eosinophils Relative 08/19/2013 1  0 - 5 % Final  . Eosinophils Absolute 08/19/2013 0.1  0.0 - 0.7 K/uL Final  . Basophils Relative 08/19/2013 0  0 - 1 % Final  . Basophils Absolute 08/19/2013 0.0  0.0 - 0.1 K/uL Final  . Retic Ct Pct 08/19/2013 1.3  0.4 - 3.1 % Final  . RBC. 08/19/2013 4.38  3.87 - 5.11 MIL/uL Final  . Retic Count, Manual 08/19/2013 56.9  19.0 - 186.0 K/uL Final  . Sodium 08/19/2013 142  137 - 147 mEq/Austin Final  . Potassium 08/19/2013 4.3  3.7 - 5.3 mEq/Austin Final  . Chloride 08/19/2013 105  96 - 112 mEq/Austin Final  . CO2 08/19/2013 25  19 - 32 mEq/Austin Final  . Glucose, Bld 08/19/2013 83  70 - 99 mg/dL Final  . BUN 08/19/2013 8  6 - 23 mg/dL Final  . Creatinine, Ser 08/19/2013 0.70  0.50 - 1.10 mg/dL Final  . Calcium 08/19/2013 9.2  8.4 - 10.5 mg/dL Final  . Total Protein 08/19/2013 7.2  6.0 - 8.3 g/dL Final  . Albumin 08/19/2013 3.9  3.5 - 5.2 g/dL Final  . AST 08/19/2013 17  0 - 37 U/Austin Final  . ALT 08/19/2013 15  0 - 35 U/Austin Final  . Alkaline Phosphatase 08/19/2013 84  39 - 117 U/Austin Final  . Total Bilirubin 08/19/2013 0.3  0.3 - 1.2 mg/dL Final  . GFR calc non Af Amer 08/19/2013 >90  >90 mL/min Final  . GFR calc Af Amer 08/19/2013 >90  >90 mL/min Final   Comment: (NOTE)                          The eGFR has been calculated using the CKD EPI equation.                          This calculation has not been validated in all clinical situations.                          eGFR's persistently <90 mL/min signify possible Chronic Kidney                          Disease.  Marland Kitchen LDH 08/19/2013 222  94 - 250 U/[place] Final   SLIGHT HEMOLYSIS  . DAT, complement 08/19/2013 NEG   Final  . DAT, IgG 08/19/2013    Final                   Value:NEG                         Performed at [facility]  . CEA 08/19/2013 3.1  0.0 - 5.0 ng/mL Final   Performed at [facility]  . CA  125 08/19/2013 5.2  0.0 - 30.2 U/mL Final   Performed  at [facility]  . CA 19-9 08/19/2013 6.0* <35.0 U/mL Final   Performed at [facility]  . Color, Urine 08/19/2013 YELLOW  YELLOW Final  . APPearance 08/19/2013 CLEAR  CLEAR Final  . Specific Gravity, Urine 08/19/2013 <1.005* 1.005 - 1.030 Final  . pH 08/19/2013 6.0  5.0 - 8.0 Final  . Glucose, UA 08/19/2013 NEGATIVE  NEGATIVE mg/dL Final  . Hgb urine dipstick 08/19/2013 TRACE* NEGATIVE Final  . Bilirubin Urine 08/19/2013 NEGATIVE  NEGATIVE Final  . Ketones, ur 08/19/2013 NEGATIVE  NEGATIVE mg/dL Final  . Protein, ur 08/19/2013 NEGATIVE  NEGATIVE mg/dL Final  . Urobilinogen, UA 08/19/2013 0.2  0.0 - 1.0 mg/dL Final  . Nitrite 08/19/2013 NEGATIVE  NEGATIVE Final  . Leukocytes, UA 08/19/2013 NEGATIVE  NEGATIVE Final  . CRP 08/19/2013 0.8* <0.60 mg/dL Final   Performed at [facility]  . Squamous Epithelial / LPF 08/19/2013 RARE  RARE Final  . WBC, UA 08/19/2013 0-2  <3 WBC/hpf Final  . RBC / HPF 08/19/2013 0-2  <3 RBC/hpf Final  . Bacteria, UA 08/19/2013 RARE  RARE Final     RADIOGRAPHIC STUDIES: Ct Chest W Contrast  08/26/2013   CLINICAL DATA:  Dermatomyositis. Screening for increase cancer risk.  EXAM: CT CHEST, ABDOMEN, AND PELVIS WITH CONTRAST  TECHNIQUE: Multidetector CT imaging of the chest, abdomen and pelvis was performed following the standard protocol during bolus administration of intravenous contrast.  CONTRAST:  117m OMNIPAQUE IOHEXOL 300 MG/ML  SOLN  COMPARISON:  CT of the abdomen and pelvis 03/26/2011.  FINDINGS: CT CHEST FINDINGS  Small nodule along the left hemidiaphragm the on image 54 measures 6 mm and is stable. No new or enlarging pulmonary nodules. No confluent airspace opacities or effusions. Heart is normal size. Aorta is normal caliber. No mediastinal, hilar, or axillary adenopathy. Chest wall soft tissues are unremarkable.  CT ABDOMEN AND PELVIS FINDINGS  Prior cholecystectomy and  hysterectomy. Liver, spleen, pancreas, adrenals and kidneys are normal. Stomach, large and small bowel normal appearance. No free fluid, free air or adenopathy. Urinary bladder is unremarkable.  Mild calcified and noncalcified plaque in the infrarenal abdominal aorta. Mild narrowing of the proximal right common iliac artery due to noncalcified plaque. Recommend correlation for right leg claudication symptoms. 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  No acute bony abnormality or focal bone lesion.  IMPRESSION: No acute findings or evidence of malignancy in the chest, abdomen or pelvis.  Mild atherosclerotic disease in the infrarenal aorta and extending into the right common iliac artery. Proximal right common iliac artery appears mildly narrowed. Recommend correlation for clinical signs of right leg claudication.   Electronically Signed   By: KRolm BaptiseM.D.   On: 08/26/2013 14:03   Ct Abdomen Pelvis W Contrast  08/26/2013   CLINICAL DATA:  Dermatomyositis. Screening for increase cancer risk.  EXAM: CT CHEST, ABDOMEN, AND PELVIS WITH CONTRAST  TECHNIQUE: Multidetector CT imaging of the chest, abdomen and pelvis was performed following the standard protocol during bolus administration of intravenous contrast.  CONTRAST:  1055mOMNIPAQUE IOHEXOL 300 MG/ML  SOLN  COMPARISON:  CT of the abdomen and pelvis 03/26/2011.  FINDINGS: CT CHEST FINDINGS  Small nodule along the left hemidiaphragm the on image 54 measures 6 mm and is stable. No new or enlarging pulmonary nodules. No confluent airspace opacities or effusions. Heart is normal size. Aorta is normal caliber. No mediastinal, hilar, or axillary adenopathy. Chest wall soft tissues are unremarkable.  CT ABDOMEN  AND PELVIS FINDINGS  Prior cholecystectomy and hysterectomy. Liver, spleen, pancreas, adrenals and kidneys are normal. Stomach, large and small bowel normal appearance. No free fluid, free air or adenopathy. Urinary bladder is unremarkable.  Mild  calcified and noncalcified plaque in the infrarenal abdominal aorta. Mild narrowing of the proximal right common iliac artery due to noncalcified plaque. Recommend correlation for right leg claudication symptoms. 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  No acute bony abnormality or focal bone lesion.  IMPRESSION: No acute findings or evidence of malignancy in the chest, abdomen or pelvis.  Mild atherosclerotic disease in the infrarenal aorta and extending into the right common iliac artery. Proximal right common iliac artery appears mildly narrowed. Recommend correlation for clinical signs of right leg claudication.   Electronically Signed   By: Rolm Baptise M.D.   On: 08/26/2013 14:03   Mm Digital Screening Bilateral  09/09/2013   CLINICAL DATA:  Screening.  EXAM: DIGITAL SCREENING BILATERAL MAMMOGRAM WITH CAD  COMPARISON:  None.  ACR Breast Density Category b: There are scattered areas of fibroglandular density.  FINDINGS: There are no findings suspicious for malignancy. Images were processed with CAD.  IMPRESSION: No mammographic evidence of malignancy. A result letter of this screening mammogram will be mailed directly to the patient.  RECOMMENDATION: Screening mammogram in one year. (Code:SM-B-01Y)  BI-RADS CATEGORY  1: Negative.   Electronically Signed   By: Enrique Sack M.D.   On: 09/09/2013 12:22    ASSESSMENT:  1. Clinically without evidence of malignancy. No adenopathy. 2. Stable 6 mm. Nodule along left hemidiaphragm compared to a scan in January 2013. Patient has a smoking history.      RECOMMENDATIONS:  1. Referral to urology for clinical evaluation including urine cytology, cystoscopy. 2. Return visit in 6 months    The patient knows to call the clinic with any problems, questions or concerns. We can certainly see the patient much sooner if necessary.    Darrall Dears, MD 09/22/2013 3:01 PM        [place] A.  Barnet Glasgow, M.D.  NEW PATIENT EVALUATION   Name: Kathryn Austin Date: 09/22/2013 MRN: [id number] DOB: [birth year]  PCP: Collene Mares, PA-C   REFERRING PHYSICIAN: Collene Mares, PA-C  REASON FOR REFERRAL: Recommendations regarding screening for potential malignancy in the setting of dermatomyositis diagnosed in June of 2014 now taking placquenil plus methotrexate.    HISTORY OF Beason is a 38 y.o. female who is referred for recommendations regarding appropriate screening for malignancy an individual with dermatomyositis. Her primary manifestation was with a skin rash on the anterior chest and also above the knees bilaterally. Biopsy was performed of the lower extremity at which time the diagnosis was established. The patient had intermittent problems with swallowing as well as found to have reflux esophagitis. There was no evidence of change suggesting premalignancy. There is no evidence of H. pylori infection. The patient has undergone TAH/BSO and is currently on Estrace replacement therapy. Appetite is good with no nausea, vomiting, diarrhea, constipation, dysuria, hematuria, lower 70 swelling or redness, skin rash worsening, headache, or seizures.   PAST MEDICAL HISTORY:  has a past medical history of Anxiety; Hypertension; GERD (gastroesophageal reflux disease); Active smoker (2014); and Neuromuscular disorder.      Patient Information    Patient Name Sex DOB SSN   Kathryn Austin, Kathryn Austin Female [birth year] [id number]            Op  Note by Rogene Houston, MD at 01/29/2013 2:37 PM    Author: Rogene Houston, MD Service: Endoscopy Author Type: Physician   Filed: 01/29/2013 2:44 PM Note Time: 01/29/2013 2:37 PM Status: Signed   Editor: Rogene Houston, MD (Physician)      EGD PROCEDURE REPORT  PATIENT: Kathryn Austin MR#: [id number]  Birthdate: 01/23/1976, 38 y.o., female  Endoscopist: Dr. Rogene Houston, MD Procedure Date: 01/29/2013  Referred By:  Collene Mares, [facility]  Procedure: EGD & Colonoscopy  Indications: Patient is 38 year old Caucasian female who presents with recurrent nausea anorexia weight loss and irregular bowel movements. Lately she has been constipated. She denies melena or rectal bleeding. 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  She is undergoing diagnostic EGD and colonoscopy.  Informed Consent: The risks, benefits, alternatives & imponderables which include, but are not limited to, bleeding, infection, perforation, drug reaction and potential missed lesion have been reviewed. The potential for biopsy, lesion removal, esophageal dilation, etc. have also been discussed. Questions have been answered. All parties agreeable. Please see history & physical in medical record for more information.  Medications:  Demerol 50 mg IV  Versed 8 mg IV  Promethazine 12.5 mg IV and diluted form.  Cetacaine spray topically for oropharyngeal anesthesia  EGD  Description of procedure: The endoscope was introduced through the mouth and advanced to the second portion of the duodenum without difficulty or limitations. The mucosal surfaces were surveyed very carefully during advancement of the scope and upon withdrawal.  Findings:  Esophagus: Mucosa of the esophagus was normal. Focal erythema and edema noted at GE junction. No ring or stricture present.  GEJ: 40 cm  Stomach: Stomach was empty and distended very well with insufflation. Folds in the proximal stomach are normal. Examination mucosa and body was normal. Patchy erythema and granularity noted at antrum. No erosions or ulcer noted. Pyloric channel was patent. Angularis, fundus and cardia were examined by retroflexion of the scope and were unremarkable.  Duodenum: Normal bulbar and post bulbar mucosa.  Therapeutic/Diagnostic Maneuvers Performed: None  COLONOSCOPY  Description of procedure: After a digital rectal exam was performed,  that colonoscope was advanced from the anus through the rectum and colon to the area of the cecum, ileocecal valve and appendiceal orifice. The cecum was deeply intubated. These structures were well-seen and photographed for the record. From the level of the cecum and ileocecal valve, the scope was slowly and cautiously withdrawn. The mucosal surfaces were carefully surveyed utilizing scope tip to flexion to facilitate fold flattening as needed. The scope was pulled down into the rectum where a thorough exam including retroflexion was performed.  Findings:  Prep satisfactory.  Normal mucosa of colon.  4 mm rectal polyp ablated while cold biopsy.  Small hemorrhoids below the dentate line.  Therapeutic/Diagnostic Maneuvers Performed: See above  Complications: None  Cecal Withdrawal Time: 14 minutes  Impression:  EGD findings;  Mild changes of reflux esophagitis limited to GE junction.  Nonerosive antral gastritis.  Colonoscopy findings;  Examination performed to cecum.  4 mm rectal polyp ablated via cold biopsy.  Small external hemorrhoids.  Recommendations:  Dexilant 60 mg by mouth every morning(patient to pick up samples from the office).  H. pylori serology.  High fiber diet.  I will contact patient with results of biopsy and blood test and further recommendations.  REHMAN,NAJEEB U 01/29/2013 2:37 PM  CC: Dr. Collene Mares, Marland Kitchen, PA-C & Dr. Rayne Du  PAST SURGICAL HISTORY: Past Surgical History  Procedure Laterality Date  . Cesarean section      x2  . Tonsillectomy    . Dilation and curettage of uterus    . Tubal ligation    . Cholecystectomy  03/31/2011    Procedure: LAPAROSCOPIC CHOLECYSTECTOMY;  Surgeon: Jamesetta So, MD;  Location: [facility];  Service: General;  Laterality: N/A;  . Abdominal hysterectomy N/A 09/04/2012    Procedure: HYSTERECTOMY ABDOMINAL;  Surgeon: Florian Buff, MD;  Location: [facility];  Service: Gynecology;  Laterality: N/A;  . Salpingoophorectomy Bilateral 09/04/2012      Procedure: BILATERAL SALPINGO OOPHORECTOMY;  Surgeon: Florian Buff, MD;  Location: [facility];  Service: Gynecology;  Laterality: Bilateral;  . Colonoscopy with esophagogastroduodenoscopy (egd) N/A 01/29/2013    Procedure: COLONOSCOPY WITH ESOPHAGOGASTRODUODENOSCOPY (EGD);  Surgeon: Rogene Houston, MD;  Location: [facility] SUITE;  Service: Endoscopy;  Laterality: N/A;  1200-moved to 1250 Ann to notify pt     CURRENT MEDICATIONS: has a current medication list which includes the following prescription(s): alprazolam, estradiol, folic acid, hydrocodone-acetaminophen, hydroxychloroquine, and methotrexate.   ALLERGIES: Review of patient's allergies indicates no known allergies.   SOCIAL HISTORY:  reports that she has been smoking Cigarettes.  She has a 6.25 pack-year smoking history. She has never used smokeless tobacco. She reports that she does not drink alcohol or use illicit drugs.   FAMILY HISTORY: family history includes COPD in her mother; Cancer in her father; Diabetes in her mother; Heart disease in her mother; Hypertension in her mother; Stroke in her mother. There is no history of Anesthesia problems, Hypotension, Malignant hyperthermia, or Pseudochol deficiency.    REVIEW OF SYSTEMS:  Other than that discussed above is noncontributory.    PHYSICAL EXAM:  weight is 146 lb (66.225 kg). Her oral temperature is 98.3 F (36.8 C). Her blood pressure is 120/73 and her pulse is 69. Her respiration is 16.    GENERAL:alert, no distress and comfortable SKIN: skin color, texture, turgor are normal, no rashes or significant lesions. No evidence of significant induration. EYES: normal, Conjunctiva are pink and non-injected, sclera clear OROPHARYNX:no exudate, no erythema and lips, buccal mucosa, and tongue normal  NECK: supple, thyroid normal size, non-tender, without nodularity CHEST: Normal AP diameter with no breast masses. LYMPH:  no palpable lymphadenopathy in the cervical, axillary or  inguinal LUNGS: clear to auscultation and percussion with normal breathing effort HEART: regular rate & rhythm and no murmurs ABDOMEN:abdomen soft, non-tender and normal bowel sounds MUSCULOSKELETALl:no cyanosis of digits, no clubbing or edema  NEURO: alert & oriented x 3 with fluent speech, no focal motor/sensory deficits    LABORATORY DATA:  No visits with results within 30 Day(s) from this visit. Latest known visit with results is:  Office Visit on 08/19/2013  Component Date Value Ref Range Status  . WBC 08/19/2013 10.3  4.0 - 10.5 K/uL Final  . RBC 08/19/2013 4.38  3.87 - 5.11 MIL/uL Final  . Hemoglobin 08/19/2013 14.2  12.0 - 15.0 g/dL Final  . HCT 08/19/2013 40.4  36.0 - 46.0 % Final  . MCV 08/19/2013 92.2  78.0 - 100.0 fL Final  . MCH 08/19/2013 32.4  26.0 - 34.0 pg Final  . MCHC 08/19/2013 35.1  30.0 - 36.0 g/dL Final  . RDW 08/19/2013 13.3  11.5 - 15.5 % Final  . Platelets 08/19/2013 279  150 - 400 K/uL Final  . Neutrophils Relative % 08/19/2013 75  43 - 77 % Final  .  Neutro Abs 08/19/2013 7.7  1.7 - 7.7 K/uL Final  . Lymphocytes Relative 08/19/2013 21  12 - 46 % Final  . Lymphs Abs 08/19/2013 2.2  0.7 - 4.0 K/uL Final  . Monocytes Relative 08/19/2013 3  3 - 12 % Final  . Monocytes Absolute 08/19/2013 0.3  0.1 - 1.0 K/uL Final  . Eosinophils Relative 08/19/2013 1  0 - 5 % Final  . Eosinophils Absolute 08/19/2013 0.1  0.0 - 0.7 K/uL Final  . Basophils Relative 08/19/2013 0  0 - 1 % Final  . Basophils Absolute 08/19/2013 0.0  0.0 - 0.1 K/uL Final  . Retic Ct Pct 08/19/2013 1.3  0.4 - 3.1 % Final  . RBC. 08/19/2013 4.38  3.87 - 5.11 MIL/uL Final  . Retic Count, Manual 08/19/2013 56.9  19.0 - 186.0 K/uL Final  . Sodium 08/19/2013 142  137 - 147 mEq/[place] Final  . Potassium 08/19/2013 4.3  3.7 - 5.3 mEq/[place] Final  . Chloride 08/19/2013 105  96 - 112 mEq/[place] Final  . CO2 08/19/2013 25  19 - 32 mEq/[place] Final  . Glucose, Bld 08/19/2013 83  70 - 99 mg/dL Final  . BUN 08/19/2013 8  6 -  23 mg/dL Final  . Creatinine, Ser 08/19/2013 0.70  0.50 - 1.10 mg/dL Final  . Calcium 08/19/2013 9.2  8.4 - 10.5 mg/dL Final  . Total Protein 08/19/2013 7.2  6.0 - 8.3 g/dL Final  . Albumin 08/19/2013 3.9  3.5 - 5.2 g/dL Final  . AST 08/19/2013 17  0 - 37 U/[place] Final  . ALT 08/19/2013 15  0 - 35 U/[place] Final  . Alkaline Phosphatase 08/19/2013 84  39 - 117 U/[place] Final  . Total Bilirubin 08/19/2013 0.3  0.3 - 1.2 mg/dL Final  . GFR calc non Af Amer 08/19/2013 >90  >90 mL/min Final  . GFR calc Af Amer 08/19/2013 >90  >90 mL/min Final   Comment: (NOTE)                          The eGFR has been calculated using the CKD EPI equation.                          This calculation has not been validated in all clinical situations.                          eGFR's persistently <90 mL/min signify possible Chronic Kidney                          Disease.  Marland Kitchen LDH 08/19/2013 222  94 - 250 U/[place] Final   SLIGHT HEMOLYSIS  . DAT, complement 08/19/2013 NEG   Final  . DAT, IgG 08/19/2013    Final                   Value:NEG                         Performed at [facility]  . CEA 08/19/2013 3.1  0.0 - 5.0 ng/mL Final   Performed at [facility]  . CA 125 08/19/2013 5.2  0.0 - 30.2 U/mL Final   Performed at [facility]  . CA 19-9 08/19/2013 6.0* <35.0 U/mL Final   Performed at [facility]  . Color, Urine 08/19/2013  YELLOW  YELLOW Final  . APPearance 08/19/2013 CLEAR  CLEAR Final  . Specific Gravity, Urine 08/19/2013 <1.005* 1.005 - 1.030 Final  . pH 08/19/2013 6.0  5.0 - 8.0 Final  . Glucose, UA 08/19/2013 NEGATIVE  NEGATIVE mg/dL Final  . Hgb urine dipstick 08/19/2013 TRACE* NEGATIVE Final  . Bilirubin Urine 08/19/2013 NEGATIVE  NEGATIVE Final  . Ketones, ur 08/19/2013 NEGATIVE  NEGATIVE mg/dL Final  . Protein, ur 08/19/2013 NEGATIVE  NEGATIVE mg/dL Final  . Urobilinogen, UA 08/19/2013 0.2  0.0 - 1.0 mg/dL Final  . Nitrite 08/19/2013 NEGATIVE  NEGATIVE Final  . Leukocytes, UA  08/19/2013 NEGATIVE  NEGATIVE Final  . CRP 08/19/2013 0.8* <0.60 mg/dL Final   Performed at [facility]  . Squamous Epithelial / LPF 08/19/2013 RARE  RARE Final  . WBC, UA 08/19/2013 0-2  <3 WBC/hpf Final  . RBC / HPF 08/19/2013 0-2  <3 RBC/hpf Final  . Bacteria, UA 08/19/2013 RARE  RARE Final    Urinalysis    Component Value Date/Time   COLORURINE YELLOW 08/19/2013 1450   APPEARANCEUR CLEAR 08/19/2013 1450   LABSPEC <1.005* 08/19/2013 1450   PHURINE 6.0 08/19/2013 1450   GLUCOSEU NEGATIVE 08/19/2013 1450   HGBUR TRACE* 08/19/2013 1450   BILIRUBINUR NEGATIVE 08/19/2013 1450   KETONESUR NEGATIVE 08/19/2013 1450   PROTEINUR NEGATIVE 08/19/2013 1450   PROTEINUR neg 10/09/2012 1442   UROBILINOGEN 0.2 08/19/2013 1450   NITRITE NEGATIVE 08/19/2013 1450   NITRITE neg 10/09/2012 1442   LEUKOCYTESUR NEGATIVE 08/19/2013 1450      _0 : US Transvaginal Non-OB Status: Final result            Study Result    GYNECOLOGIC SONOGRAM  Kathryn Austin is a 38 y.o. [id number] LMP-08/11/2012 for a pelvic sonogram for dyspareunia, irregular cycles.  Uterus 8.1 x 4.8 x 3.8 cm, anteverted, multiple small hyperechoic foci noted within myometrium  Endometrium 4.6 mm, symmetrical,  Right ovary 1.7 x 1.1 x 0.6 cm, Pt states no surgery to Rt ovary (size noted to be smaller than LT)  Left ovary 3.2 x 2.7 x 2.5 cm, Appears WNL  Small Nabothian Cyst noted in cervix=73m  Technician Comments:  No free fluid or adnexal masses noted  MLazarus Gowda 08/27/2012  1:32 PM  Clinical Impression and recommendations:  I have reviewed the sonogram results above, combined with the patient's current clinical course, below are my impressions and any appropriate recommendations for management based on the sonographic findings.  1. Normal pelvic sonogram  2. No finding to explain patient' clinical symptoms  EURE,LUTHER H  08/27/2012  2:05 PM     PATHOLOGY:  FINAL for HSANIYYA, GAU ([id number] Patient: Kathryn Austin, ILLESCASCollected: [id number]ion: [id number]: 09/04/2012 LTania AdeDOB: [birth year]Age: [age]Gender: F Reported: 09/05/2012 [address] Patient Ph: [phone] #: [id number] HoffNC [id number] #: [id number] #: Phone: [phone]: CC: REPORT OF SURGICAL PATHOLOGY FINAL DIAGNOSIS Diagnosis Uterus, ovaries and fallopian tubes - BENIGN PROLIFERATIVE PHASE ENDOMETRIUM; NEGATIVE FOR HYPERPLASIA OR MALIGNANCY. - UNREMARKABLE UTERINE SEROSA. - BENIGN CERVICAL MUCOSA; NEGATIVE FOR INTRAEPITHELIAL LESION OR MALIGNANCY. - BENIGN RIGHT AND LEFT FALLOPIAN TUBE WITH SEROUS TYPE PARATUBAL CYST; NEGATIVE FOR ATYPIA OR MALIGNANCY. - BENIGN RIGHT OVARY; NEGATIVE FOR ATYPIA OR MALIGNANCY. - BENIGN LEFT OVARY WITH FOLLICULAR TYPE CYSTS; NEGATIVE FOR ATYPIA OR MALIGNANCY. CMaliRUND DO Pathologist, Electronic Signature (Case signed 09/05/2012) Specimen Gross and Clinical Information Specimen(s) Obtained: Uterus, ovaries and  fallopian tubes Specimen Clinical Information dysfunctional uterine bleeding, dyspareunia Gross Specimen: Received in formalin labeled uterus and cervix, bilateral fallopian tubes and ovaries. Specimen integrity (intact/incised/disrupted): Intact uterus and cervix with attached right adnexa and separately received left adnexa. Weight: 75 grams, excluding adnexa. Serosa: Asymmetrical uterus measuring 8.6 cm fundus to cervix x 5.0 cm cornu to cornu x 3.5 cm anterior to posterior. Cervix: Tan pink and smooth, with a 1.7 cm linear disruption on the posterior aspect (possible surgical artifact). The ectocervix measures 3.8 x 3.7 cm with a 1.6 cm slit-like os. The external surface is tan pink smooth and glistening. The endocervical canal measures 2.8 cm in length and is tan pink and corrugated. The cervical stroma is tan white and fibrous with multiple Nabothian cysts. Endometrium: The  triangular endometrial cavity measures 3.0 x 2.2 cm, is tan pink and focally hemorrhagic. The endometrium measures 0.1 cm in thickness. Myometrium: The myometrium is tan pink and trabeculated and no lesions are grossly identified. The 1 of 2 FINAL for Kathryn Austin, Kathryn Austin ([id number]) Gross(continued) myometrium measures up to 1.4 cm in thickness. Right adnexa: The right fimbriated fallopian tube measures 3.8 cm in length x 0.4 cm in diameter. The serosal surface is tan brown and smooth with two clear serous fluid filled paratubal cysts measuring 0.5 and 0.8 cm in greatest dimension. Sectioning the fallopian tube reveals a tan white cut surface with a pinpoint lumen. The adjacent possible right ovary measures 1.5 x 1.2 x 0.6 cm. The external surface is tan pink and smooth. Sectioning reveals a tan white cut surface. Left adnexa: The left fimbriated fallopian tube measures 4.5 cm in length x 0.5 cm in diameter. The serosal surface is tan pink and smooth. The adjacent left ovary measures 4.2 x 3.5 x 2.3 cm and weighs 11 grams. The external surface is tan pink and cerebriform with multiple cortical cystic structures. Sectioning reveals a tan pink fibrous cut surface with multiple smooth walled cysts filled with clear serous fluid measuring up to 1.5 cm in greatest dimension, and multiple tan yellow hemorrhagic corpus luteum. Block Summary: A = anterior cervix B = posterior cervix C and D = anterior full thickness endomyometrium E and F = posterior full thickness endomyometrium G = cross sections of fimbriated end of right fallopian tube H = possible right ovary I = cross sections of fimbriated end of left fallopian tube J and K = left ovary (KL:caf 09/04/12) Report signed out from the following location(s) Technical Component and Interpretation performed at [place], [place], [place] [address].    FINAL for Kathryn Austin, Kathryn Austin ([id number]) Patient:  Kathryn Austin, Kathryn Austin Collected: [id number] Client: [facility] Accession: [id number] Received: 01/30/2013 Hildred Laser DOB: Nov 10, 1975 Age: 73 Gender: F Reported: 01/31/2013 [address] Patient Ph: [phone] MRN #: [id number] Linna Hoff [place] [address] Visit #: [id number] Chart #: Phone: [phone] Fax: CC: REPORT OF SURGICAL PATHOLOGY FINAL DIAGNOSIS Diagnosis Rectum, polyp(s) - TUBULAR ADENOMA - HIGH GRADE DYSPLASIA IS NOT IDENTIFIED. Enid Cutter MD Pathologist, Electronic Signature (Case signed 01/31/2013) Specimen Gross and Clinical Information Specimen(s) Obtained: Rectum, polyp(s) Specimen Clinical Information Pre-op: constipation; GERD; Post-op: mild changes associated with reflux; patchy gastric redness; GEJ @ 40 cm; COLON: hemorrhoids, rectal polyp Gross Received in formalin are tan, soft tissue fragments that are submitted in toto. Number: two, Size: 0.3 and 0.4 cm. (Aggregate measurement: 0.5 x0.4 x 0.2 cm) (GP:gt, 01/30/13) Report signed out from the following location(s) Technical Component performed at [facility].  [address],[place] [address].IOMB:[id number],[id number]., Interpretation performed at [facility], [place], [place] [address]. 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 #2. Increase risk of malignancies 70% of which are caused by cancer the pancreas, cervix, ovary, stomach, colon, and bladder. #3. Gastroesophageal reflux disease   PLAN:  #1. CT chest abdomen and pelvis with contrast as a baseline. #2. Urology consult for cystoscopy and NMP22  Bladder Check testing. #3. Lab tests to include CBC, chem profile, CA 125, CEA, CA 19-9, and C-reactive protein. #4.Urinalysis and urine cytology. #5. Followup in 3 weeks. #6. Stop smoking!  I appreciate the opportunity of sharing in her care.   Darrall Dears, MD 09/22/2013 3:01 PM    DISCLAIMER:  This note was dictated with voice recognition softwre.  Similar sounding words can inadvertently be transcribed inaccurately and may not be corrected upon review.

## 2013-09-22 NOTE — Progress Notes (Signed)
Referral sent to [facility].  Papers faxed on 7/27. Office to contact use with appointment.

## 2013-09-22 NOTE — Patient Instructions (Signed)
[facility] Discharge Instructions  RECOMMENDATIONS MADE BY THE CONSULTANT AND ANY TEST RESULTS WILL BE SENT TO YOUR REFERRING PHYSICIAN.  EXAM FINDINGS BY THE PHYSICIAN TODAY AND SIGNS OR SYMPTOMS TO REPORT TO CLINIC OR PRIMARY PHYSICIAN: Test results and findings as discussed by Dr. Bubba Hales. We will pursue the referral to [facility] and will call you with follow-up  MEDICATIONS PRESCRIBED:  none  INSTRUCTIONS/FOLLOW-UP: Follow-up in 6 months.  Thank you for choosing [facility] to provide your oncology and hematology care.  To afford each patient quality time with our providers, please arrive at least 15 minutes before your scheduled appointment time.  With your help, our goal is to use those 15 minutes to complete the necessary work-up to ensure our physicians have the information they need to help with your evaluation and healthcare recommendations.    Effective January 1st, 2014, we ask that you re-schedule your appointment with our physicians should you arrive 10 or more minutes late for your appointment.  We strive to give you quality time with our providers, and arriving late affects you and other patients whose appointments are after yours.    Again, thank you for choosing [facility].  Our hope is that these requests will decrease the amount of time that you wait before being seen by our physicians.       _____________________________________________________________  Should you have questions after your visit to [facility], please contact our office at [phone] between the hours of 8:30 a.m. and 4:30 p.m.  Voicemails left after 4:30 p.m. will not be returned until the following business day.  For prescription refill requests, have your pharmacy contact our office with your prescription refill request.    _______________________________________________________________  We hope that we have given you very good  care.  You may receive a patient satisfaction survey in the mail, please complete it and return it as soon as possible.  We value your feedback!  _______________________________________________________________  Have you asked about our STAR program?  STAR stands for Survivorship Training and Rehabilitation, and this is a nationally recognized cancer care program that focuses on survivorship and rehabilitation.  Cancer and cancer treatments may cause problems, such as, pain, making you feel tired and keeping you from doing the things that you need or want to do. Cancer rehabilitation can help. Our goal is to reduce these troubling effects and help you have the best quality of life possible.  You may receive a survey from a nurse that asks questions about your current state of health.  Based on the survey results, all eligible patients will be referred to the [facility] program for an evaluation so we can better serve you!  A frequently asked questions sheet is available upon request.

## 2013-09-24 ENCOUNTER — Encounter (HOSPITAL_COMMUNITY): Payer: Self-pay | Admitting: Lab

## 2013-10-28 ENCOUNTER — Ambulatory Visit (INDEPENDENT_AMBULATORY_CARE_PROVIDER_SITE_OTHER): Payer: Medicaid Other | Admitting: Urology

## 2013-10-28 DIAGNOSIS — N329 Bladder disorder, unspecified: Secondary | ICD-10-CM

## 2013-11-24 ENCOUNTER — Other Ambulatory Visit: Payer: Self-pay | Admitting: Obstetrics & Gynecology

## 2013-12-29 ENCOUNTER — Encounter (HOSPITAL_COMMUNITY): Payer: Self-pay

## 2014-03-25 ENCOUNTER — Ambulatory Visit (HOSPITAL_COMMUNITY): Payer: Medicaid Other | Admitting: Hematology & Oncology

## 2014-04-03 ENCOUNTER — Encounter (HOSPITAL_COMMUNITY): Payer: Self-pay

## 2014-09-20 ENCOUNTER — Emergency Department (HOSPITAL_COMMUNITY)
Admission: EM | Admit: 2014-09-20 | Discharge: 2014-09-20 | Disposition: A | Discharge status: Home / Self Care | Payer: Medicaid Other | Attending: Emergency Medicine | Admitting: Emergency Medicine

## 2014-09-20 ENCOUNTER — Encounter (HOSPITAL_COMMUNITY): Payer: Self-pay | Admitting: *Deleted

## 2014-09-20 DIAGNOSIS — Z791 Long term (current) use of non-steroidal anti-inflammatories (NSAID): Secondary | ICD-10-CM | POA: Diagnosis not present

## 2014-09-20 DIAGNOSIS — J069 Acute upper respiratory infection, unspecified: Secondary | ICD-10-CM | POA: Diagnosis not present

## 2014-09-20 DIAGNOSIS — Z8719 Personal history of other diseases of the digestive system: Secondary | ICD-10-CM | POA: Insufficient documentation

## 2014-09-20 DIAGNOSIS — Z8669 Personal history of other diseases of the nervous system and sense organs: Secondary | ICD-10-CM | POA: Insufficient documentation

## 2014-09-20 DIAGNOSIS — M545 Low back pain: Secondary | ICD-10-CM | POA: Insufficient documentation

## 2014-09-20 DIAGNOSIS — I10 Essential (primary) hypertension: Secondary | ICD-10-CM | POA: Diagnosis not present

## 2014-09-20 DIAGNOSIS — Z79899 Other long term (current) drug therapy: Secondary | ICD-10-CM | POA: Insufficient documentation

## 2014-09-20 DIAGNOSIS — R1011 Right upper quadrant pain: Secondary | ICD-10-CM | POA: Diagnosis not present

## 2014-09-20 DIAGNOSIS — R350 Frequency of micturition: Secondary | ICD-10-CM | POA: Diagnosis present

## 2014-09-20 DIAGNOSIS — Z3202 Encounter for pregnancy test, result negative: Secondary | ICD-10-CM | POA: Diagnosis not present

## 2014-09-20 DIAGNOSIS — Z793 Long term (current) use of hormonal contraceptives: Secondary | ICD-10-CM | POA: Diagnosis not present

## 2014-09-20 DIAGNOSIS — R197 Diarrhea, unspecified: Secondary | ICD-10-CM | POA: Diagnosis not present

## 2014-09-20 DIAGNOSIS — R110 Nausea: Secondary | ICD-10-CM | POA: Insufficient documentation

## 2014-09-20 DIAGNOSIS — F419 Anxiety disorder, unspecified: Secondary | ICD-10-CM | POA: Diagnosis not present

## 2014-09-20 DIAGNOSIS — Z720 Tobacco use: Secondary | ICD-10-CM | POA: Diagnosis not present

## 2014-09-20 DIAGNOSIS — R1013 Epigastric pain: Secondary | ICD-10-CM | POA: Diagnosis not present

## 2014-09-20 DIAGNOSIS — M791 Myalgia, unspecified site: Secondary | ICD-10-CM

## 2014-09-20 HISTORY — DX: Dermatopolymyositis, unspecified, organ involvement unspecified: M33.90

## 2014-09-20 HISTORY — DX: Other dermatomyositis without myopathy: M33.13

## 2014-09-20 LAB — URINE MICROSCOPIC-ADD ON

## 2014-09-20 LAB — URINALYSIS, ROUTINE W REFLEX MICROSCOPIC
Bilirubin Urine: NEGATIVE
Glucose, UA: NEGATIVE mg/dL
Ketones, ur: NEGATIVE mg/dL
Leukocytes, UA: NEGATIVE
Nitrite: NEGATIVE
PH: 6 (ref 5.0–8.0)
Protein, ur: NEGATIVE mg/dL
SPECIFIC GRAVITY, URINE: 1.015 (ref 1.005–1.030)
Urobilinogen, UA: 0.2 mg/dL (ref 0.0–1.0)

## 2014-09-20 LAB — RAPID STREP SCREEN (MED CTR MEBANE ONLY): Streptococcus, Group A Screen (Direct): NEGATIVE

## 2014-09-20 LAB — PREGNANCY, URINE: Preg Test, Ur: NEGATIVE

## 2014-09-20 MED ORDER — KETOROLAC TROMETHAMINE 60 MG/2ML IM SOLN
60.0000 mg | Freq: Once | INTRAMUSCULAR | Status: AC
  Administered 2014-09-20: 60 mg via INTRAMUSCULAR
  Filled 2014-09-20: qty 2

## 2014-09-20 MED ORDER — NAPROXEN 500 MG PO TABS: 500.0000 mg | ORAL_TABLET | Freq: Two times a day (BID) | ORAL | Status: DC

## 2014-09-20 NOTE — Discharge Instructions (Signed)

## 2014-09-20 NOTE — ED Provider Notes (Signed)
CSN: [id number]     Arrival date & time 09/20/14  2148 History   This chart was scribed for Noemi Chapel, MD by Forrestine Him, ED Scribe. This patient was seen in room APA05/APA05 and the patient's care was started 10:02 PM.    Chief Complaint  Patient presents with  . Urinary Frequency   The history is provided by the patient. No language interpreter was used.    HPI Comments: SHAWN DANNENBERG is a 39 y.o. female with a PMHx of HTN who presents to the Emergency Department complaining of constant, ongoing, unchanged urinary frequency x 1 week. She also reports ongoing lower back pain, diarrhea, nausea, abdominal pain. Ms. Vahey admits to 3-4 episodes of loose/watery stools in the last couple days. Pt states she was recently treated for a UTI 2-3 weeks ago with Cipro. At time of visit with PCP, pt was having dysuria only without any associated symptoms. After starting on medication, pt states she noted above symptoms. Z-Pak started yesterday for upper respiratory issues. No recent fever or chills. No known allergies to medications.   Past Medical History  Diagnosis Date  . Anxiety   . Hypertension   . GERD (gastroesophageal reflux disease)   . Active smoker 2014  . Neuromuscular disorder     dramata myocytis  . Dermatomyositis    Past Surgical History  Procedure Laterality Date  . Cesarean section      x2  . Tonsillectomy    . Dilation and curettage of uterus    . Tubal ligation    . Cholecystectomy  03/31/2011    Procedure: LAPAROSCOPIC CHOLECYSTECTOMY;  Surgeon: Jamesetta So, MD;  Location: [facility];  Service: General;  Laterality: N/A;  . Abdominal hysterectomy N/A 09/04/2012    Procedure: HYSTERECTOMY ABDOMINAL;  Surgeon: Florian Buff, MD;  Location: [facility] ORS;  Service: Gynecology;  Laterality: N/A;  . Salpingoophorectomy Bilateral 09/04/2012    Procedure: BILATERAL SALPINGO OOPHORECTOMY;  Surgeon: Florian Buff, MD;  Location: [facility] ORS;  Service: Gynecology;  Laterality: Bilateral;   . Colonoscopy with esophagogastroduodenoscopy (egd) N/A 01/29/2013    Procedure: COLONOSCOPY WITH ESOPHAGOGASTRODUODENOSCOPY (EGD);  Surgeon: Rogene Houston, MD;  Location: [facility] END[facility] SU[facility];  Service: Endoscopy;  Laterality: N/A;  1200-moved to 1250 Ann to notify pt   Family History  Problem Relation Age of Onset  . Anesthesia problems Neg Hx   . Hypotension Neg Hx   . Malignant hyperthermia Neg Hx   . Pseudochol deficiency Neg Hx   . Diabetes Mother   . Hypertension Mother   . COPD Mother   . Heart disease Mother   . Stroke Mother   . Cancer Father    History  Substance Use Topics  . Smoking status: Current Every Day Smoker -- 0.25 packs/day for 25 years    Types: Cigarettes  . Smokeless tobacco: Never Used     Comment: 1 pack a day x 20 yrs  . Alcohol Use: No   OB History    Gravida Para Term Preterm AB TAB SAB Ectopic Multiple Living   4 3 0 0 1 0 1 0 0 0      Review of Systems  Constitutional: Negative for fever and chills.  Gastrointestinal: Positive for nausea, abdominal pain and diarrhea. Negative for vomiting.  Genitourinary: Positive for frequency.  Musculoskeletal: Positive for back pain.  Neurological: Negative for dizziness and weakness.  Psychiatric/Behavioral: Negative for confusion.  All other systems reviewed and are negative.     Allergies  Codeine  Home Medications   Prior to Admission medications   Medication Sig Start Date End Date Taking? Authorizing Provider  ALPRAZolam Duanne Moron) 0.5 MG tablet Take 0.5 mg by mouth 3 (three) times daily as needed for sleep or anxiety. anxiety   Yes Historical Provider, MD  amphetamine-dextroamphetamine (ADDERALL) 20 MG tablet Take 20 mg by mouth daily. 09/14/14  Yes Historical Provider, MD  azithromycin (ZITHROMAX Z-PAK) 250 MG tablet Take 250-500 mg by mouth daily. Started on 09/16/2014   Yes Historical Provider, MD  estradiol (ESTRACE) 2 MG tablet TAKE 1 TABLET (2 MG TOTAL) BY MOUTH DAILY. 11/25/13  Yes Florian Buff, MD  fluticasone (FLONASE) 50 MCG/ACT nasal spray Place 1 spray into both nostrils daily as needed for allergies or rhinitis.   Yes Historical Provider, MD  folic acid (FOLVITE) 1 MG tablet Take 1 mg by mouth daily.   Yes Historical Provider, MD  HYDROcodone-acetaminophen (NORCO/VICODIN) 5-325 MG per tablet Take 1 tablet by mouth every 6 (six) hours as needed for moderate pain.   Yes Historical Provider, MD  hydroxychloroquine (PLAQUENIL) 200 MG tablet Take 200 mg by mouth 2 (two) times daily.    Yes Historical Provider, MD  methotrexate (RHEUMATREX) 2.5 MG tablet Take 20 mg by mouth once a week. Caution:Chemotherapy. Protect from light. Takes on Wednesday    Historical Provider, MD  naproxen (NAPROSYN) 500 MG tablet Take 1 tablet (500 mg total) by mouth 2 (two) times daily with a meal. 09/20/14   Noemi Chapel, MD   Triage Vitals: BP 161/78 mmHg  Pulse 80  Temp(Src) 98 F (36.7 C) (Oral)  Resp 20  Ht 5\' 9"  (1.753 m)  Wt 145 lb (65.772 kg)  BMI 21.40 kg/m2  SpO2 100%  LMP 08/30/2012   Physical Exam  Constitutional: She appears well-developed and well-nourished. No distress.  HENT:  Head: Normocephalic and atraumatic.  Mouth/Throat: Posterior oropharyngeal erythema present. No oropharyngeal exudate.  Eyes: Conjunctivae and EOM are normal. Pupils are equal, round, and reactive to light. Right eye exhibits no discharge. Left eye exhibits no discharge. No scleral icterus.  Neck: Normal range of motion. Neck supple. No JVD present. No thyromegaly present.  Cardiovascular: Normal rate, regular rhythm, normal heart sounds and intact distal pulses.  Exam reveals no gallop and no friction rub.   No murmur heard. Pulmonary/Chest: Effort normal and breath sounds normal. No respiratory distress. She has no wheezes. She has no rales.  Abdominal: Soft. Bowel sounds are normal. She exhibits no distension and no mass. There is tenderness.  Mild upper abdominal pain; Mostly epigastric and R upper No  Murphy's sign   Musculoskeletal: Normal range of motion. She exhibits tenderness. She exhibits no edema.  Tenderness to palpation over lower thoracic through lumbar paraspinal muscles   Lymphadenopathy:    She has no cervical adenopathy.  Neurological: She is alert. Coordination normal.  Skin: Skin is warm and dry. No rash noted. She is not diaphoretic. No erythema.  Psychiatric: She has a normal mood and affect. Her behavior is normal.  Nursing note and vitals reviewed.   ED Course  Procedures (including critical care time)  DIAGNOSTIC STUDIES: Oxygen Saturation is 100% on RA, Normal by my interpretation.    COORDINATION OF CARE: 10:02 PM- Will order rapid strep, pregnancy urine, and urinalysis. Will give Toradol. Discussed treatment plan with pt at bedside and pt agreed to plan.     Labs Review Labs Reviewed  URINALYSIS, ROUTINE W REFLEX MICROSCOPIC (NOT AT [facility]) - Abnormal;  Notable for the following:    Hgb urine dipstick SMALL (*)    All other components within normal limits  URINE MICROSCOPIC-ADD ON - Abnormal; Notable for the following:    Squamous Epithelial / LPF FEW (*)    Bacteria, UA FEW (*)    All other components within normal limits  RAPID STREP SCREEN (NOT AT [facility])  CULTURE, GROUP A STREP  PREGNANCY, URINE    Imaging Review No results found.   MDM   Final diagnoses:  Myalgia  URI (upper respiratory infection)    Well appearing, normla VS other than mild htn, pt has received meds as below, UA and Strep neg, likely viral, stable for d/c.   I personally performed the services described in this documentation, which was scribed in my presence. The recorded information has been reviewed and is accurate.  Meds given in ED:  Medications  ketorolac (TORADOL) injection 60 mg (60 mg Intramuscular Given 09/20/14 2217)    New Prescriptions   NAPROXEN (NAPROSYN) 500 MG TABLET    Take 1 tablet (500 mg total) by mouth 2 (two) times daily with a meal.        Noemi Chapel, MD 09/20/14 2310

## 2014-09-20 NOTE — ED Notes (Signed)
Dr Sabra Heck called to room to speak with pt prior to discharge. Pt unhappy with diagnosis. Stated that she did not think she had a "virus". Discussed follow up care with pt and prescriptions with her, stated she could not take naproxen. offered to take script back from pt, but she chose to take it and discuss it with her PCP. Encouraged follow up with her PCP ASAP.

## 2014-09-20 NOTE — ED Notes (Signed)
Pt states she was treated for a uti a week ago. Pt now states she is having back pain, lower abdominal pain, urinary frequency, fever?, nausea, and burning all over.

## 2014-09-23 LAB — CULTURE, GROUP A STREP: Strep A Culture: NEGATIVE

## 2014-11-03 ENCOUNTER — Ambulatory Visit: Payer: Medicaid Other | Admitting: Urology

## 2014-11-30 ENCOUNTER — Other Ambulatory Visit: Payer: Self-pay | Admitting: Obstetrics & Gynecology

## 2015-01-13 DIAGNOSIS — Z1389 Encounter for screening for other disorder: Secondary | ICD-10-CM | POA: Diagnosis not present

## 2015-01-13 DIAGNOSIS — R229 Localized swelling, mass and lump, unspecified: Secondary | ICD-10-CM | POA: Diagnosis not present

## 2015-01-13 DIAGNOSIS — Z6822 Body mass index (BMI) 22.0-22.9, adult: Secondary | ICD-10-CM | POA: Diagnosis not present

## 2015-01-13 DIAGNOSIS — M3390 Dermatopolymyositis, unspecified, organ involvement unspecified: Secondary | ICD-10-CM | POA: Diagnosis not present

## 2015-01-13 DIAGNOSIS — L989 Disorder of the skin and subcutaneous tissue, unspecified: Secondary | ICD-10-CM | POA: Diagnosis not present

## 2015-01-25 DIAGNOSIS — Z90710 Acquired absence of both cervix and uterus: Secondary | ICD-10-CM | POA: Diagnosis not present

## 2015-01-25 DIAGNOSIS — Z9889 Other specified postprocedural states: Secondary | ICD-10-CM | POA: Diagnosis not present

## 2015-01-25 DIAGNOSIS — F17210 Nicotine dependence, cigarettes, uncomplicated: Secondary | ICD-10-CM | POA: Diagnosis not present

## 2015-01-25 DIAGNOSIS — M3390 Dermatopolymyositis, unspecified, organ involvement unspecified: Secondary | ICD-10-CM | POA: Diagnosis not present

## 2015-01-25 DIAGNOSIS — Z0389 Encounter for observation for other suspected diseases and conditions ruled out: Secondary | ICD-10-CM | POA: Diagnosis not present

## 2015-01-25 DIAGNOSIS — Z5181 Encounter for therapeutic drug level monitoring: Secondary | ICD-10-CM | POA: Diagnosis not present

## 2015-01-28 DIAGNOSIS — R490 Dysphonia: Secondary | ICD-10-CM | POA: Diagnosis not present

## 2015-01-28 DIAGNOSIS — J029 Acute pharyngitis, unspecified: Secondary | ICD-10-CM | POA: Diagnosis not present

## 2015-01-28 DIAGNOSIS — R1310 Dysphagia, unspecified: Secondary | ICD-10-CM | POA: Diagnosis not present

## 2015-01-28 DIAGNOSIS — R221 Localized swelling, mass and lump, neck: Secondary | ICD-10-CM | POA: Diagnosis not present

## 2015-02-01 DIAGNOSIS — R229 Localized swelling, mass and lump, unspecified: Secondary | ICD-10-CM | POA: Diagnosis not present

## 2015-02-01 DIAGNOSIS — Z1389 Encounter for screening for other disorder: Secondary | ICD-10-CM | POA: Diagnosis not present

## 2015-02-01 DIAGNOSIS — M3390 Dermatopolymyositis, unspecified, organ involvement unspecified: Secondary | ICD-10-CM | POA: Diagnosis not present

## 2015-02-01 DIAGNOSIS — L989 Disorder of the skin and subcutaneous tissue, unspecified: Secondary | ICD-10-CM | POA: Diagnosis not present

## 2015-02-01 DIAGNOSIS — Z6822 Body mass index (BMI) 22.0-22.9, adult: Secondary | ICD-10-CM | POA: Diagnosis not present

## 2015-02-05 DIAGNOSIS — M62532 Muscle wasting and atrophy, not elsewhere classified, left forearm: Secondary | ICD-10-CM | POA: Diagnosis not present

## 2015-02-05 DIAGNOSIS — R531 Weakness: Secondary | ICD-10-CM | POA: Diagnosis not present

## 2015-02-05 DIAGNOSIS — Z5181 Encounter for therapeutic drug level monitoring: Secondary | ICD-10-CM | POA: Diagnosis not present

## 2015-02-05 DIAGNOSIS — M3390 Dermatopolymyositis, unspecified, organ involvement unspecified: Secondary | ICD-10-CM | POA: Diagnosis not present

## 2015-03-02 DIAGNOSIS — L299 Pruritus, unspecified: Secondary | ICD-10-CM | POA: Diagnosis not present

## 2015-03-02 DIAGNOSIS — Z79899 Other long term (current) drug therapy: Secondary | ICD-10-CM | POA: Diagnosis not present

## 2015-03-02 DIAGNOSIS — M3310 Other dermatopolymyositis, organ involvement unspecified: Secondary | ICD-10-CM | POA: Diagnosis not present

## 2015-03-02 DIAGNOSIS — M1380 Other specified arthritis, unspecified site: Secondary | ICD-10-CM | POA: Diagnosis not present

## 2015-03-02 DIAGNOSIS — F17200 Nicotine dependence, unspecified, uncomplicated: Secondary | ICD-10-CM | POA: Diagnosis not present

## 2015-03-08 DIAGNOSIS — I889 Nonspecific lymphadenitis, unspecified: Secondary | ICD-10-CM | POA: Diagnosis not present

## 2015-03-08 DIAGNOSIS — Z1389 Encounter for screening for other disorder: Secondary | ICD-10-CM | POA: Diagnosis not present

## 2015-03-08 DIAGNOSIS — G894 Chronic pain syndrome: Secondary | ICD-10-CM | POA: Diagnosis not present

## 2015-03-08 DIAGNOSIS — Z6821 Body mass index (BMI) 21.0-21.9, adult: Secondary | ICD-10-CM | POA: Diagnosis not present

## 2015-03-08 DIAGNOSIS — F419 Anxiety disorder, unspecified: Secondary | ICD-10-CM | POA: Diagnosis not present

## 2015-03-08 DIAGNOSIS — F909 Attention-deficit hyperactivity disorder, unspecified type: Secondary | ICD-10-CM | POA: Diagnosis not present

## 2015-05-11 DIAGNOSIS — H6692 Otitis media, unspecified, left ear: Secondary | ICD-10-CM | POA: Diagnosis not present

## 2015-05-11 DIAGNOSIS — Z6822 Body mass index (BMI) 22.0-22.9, adult: Secondary | ICD-10-CM | POA: Diagnosis not present

## 2015-05-11 DIAGNOSIS — J020 Streptococcal pharyngitis: Secondary | ICD-10-CM | POA: Diagnosis not present

## 2015-05-11 DIAGNOSIS — Z1389 Encounter for screening for other disorder: Secondary | ICD-10-CM | POA: Diagnosis not present

## 2015-05-11 DIAGNOSIS — J069 Acute upper respiratory infection, unspecified: Secondary | ICD-10-CM | POA: Diagnosis not present

## 2015-05-11 DIAGNOSIS — G894 Chronic pain syndrome: Secondary | ICD-10-CM | POA: Diagnosis not present

## 2015-05-24 DIAGNOSIS — M3310 Other dermatopolymyositis, organ involvement unspecified: Secondary | ICD-10-CM | POA: Diagnosis not present

## 2015-05-24 DIAGNOSIS — M1380 Other specified arthritis, unspecified site: Secondary | ICD-10-CM | POA: Diagnosis not present

## 2015-06-01 DIAGNOSIS — G894 Chronic pain syndrome: Secondary | ICD-10-CM | POA: Diagnosis not present

## 2015-06-01 DIAGNOSIS — Z6821 Body mass index (BMI) 21.0-21.9, adult: Secondary | ICD-10-CM | POA: Diagnosis not present

## 2015-06-01 DIAGNOSIS — M3390 Dermatopolymyositis, unspecified, organ involvement unspecified: Secondary | ICD-10-CM | POA: Diagnosis not present

## 2015-06-07 DIAGNOSIS — M1380 Other specified arthritis, unspecified site: Secondary | ICD-10-CM | POA: Diagnosis not present

## 2015-06-07 DIAGNOSIS — F17200 Nicotine dependence, unspecified, uncomplicated: Secondary | ICD-10-CM | POA: Diagnosis not present

## 2015-06-07 DIAGNOSIS — M3310 Other dermatopolymyositis, organ involvement unspecified: Secondary | ICD-10-CM | POA: Diagnosis not present

## 2015-06-07 DIAGNOSIS — L568 Other specified acute skin changes due to ultraviolet radiation: Secondary | ICD-10-CM | POA: Diagnosis not present

## 2015-06-07 DIAGNOSIS — Z79899 Other long term (current) drug therapy: Secondary | ICD-10-CM | POA: Diagnosis not present

## 2015-07-27 DIAGNOSIS — F909 Attention-deficit hyperactivity disorder, unspecified type: Secondary | ICD-10-CM | POA: Diagnosis not present

## 2015-07-27 DIAGNOSIS — G894 Chronic pain syndrome: Secondary | ICD-10-CM | POA: Diagnosis not present

## 2015-07-27 DIAGNOSIS — J019 Acute sinusitis, unspecified: Secondary | ICD-10-CM | POA: Diagnosis not present

## 2015-07-27 DIAGNOSIS — Z1389 Encounter for screening for other disorder: Secondary | ICD-10-CM | POA: Diagnosis not present

## 2015-07-27 DIAGNOSIS — Z6821 Body mass index (BMI) 21.0-21.9, adult: Secondary | ICD-10-CM | POA: Diagnosis not present

## 2015-09-27 DIAGNOSIS — Z79899 Other long term (current) drug therapy: Secondary | ICD-10-CM | POA: Diagnosis not present

## 2015-09-27 DIAGNOSIS — R531 Weakness: Secondary | ICD-10-CM | POA: Diagnosis not present

## 2015-09-27 DIAGNOSIS — M3310 Other dermatopolymyositis, organ involvement unspecified: Secondary | ICD-10-CM | POA: Diagnosis not present

## 2015-09-27 DIAGNOSIS — F17200 Nicotine dependence, unspecified, uncomplicated: Secondary | ICD-10-CM | POA: Diagnosis not present

## 2015-09-27 DIAGNOSIS — M1380 Other specified arthritis, unspecified site: Secondary | ICD-10-CM | POA: Diagnosis not present

## 2015-09-27 DIAGNOSIS — Z5181 Encounter for therapeutic drug level monitoring: Secondary | ICD-10-CM | POA: Diagnosis not present

## 2015-09-27 DIAGNOSIS — L573 Poikiloderma of Civatte: Secondary | ICD-10-CM | POA: Diagnosis not present

## 2015-10-27 ENCOUNTER — Ambulatory Visit (HOSPITAL_COMMUNITY)
Admission: RE | Admit: 2015-10-27 | Discharge: 2015-10-27 | Disposition: A | Discharge status: Home / Self Care | Payer: Medicare Other | Source: Ambulatory Visit | Attending: Internal Medicine | Admitting: Internal Medicine

## 2015-10-27 ENCOUNTER — Other Ambulatory Visit (HOSPITAL_COMMUNITY): Payer: Self-pay | Admitting: Internal Medicine

## 2015-10-27 DIAGNOSIS — K219 Gastro-esophageal reflux disease without esophagitis: Secondary | ICD-10-CM | POA: Diagnosis not present

## 2015-10-27 DIAGNOSIS — Z1389 Encounter for screening for other disorder: Secondary | ICD-10-CM | POA: Diagnosis not present

## 2015-10-27 DIAGNOSIS — F909 Attention-deficit hyperactivity disorder, unspecified type: Secondary | ICD-10-CM | POA: Diagnosis not present

## 2015-10-27 DIAGNOSIS — R059 Cough, unspecified: Secondary | ICD-10-CM

## 2015-10-27 DIAGNOSIS — R05 Cough: Secondary | ICD-10-CM

## 2015-10-27 DIAGNOSIS — Z6821 Body mass index (BMI) 21.0-21.9, adult: Secondary | ICD-10-CM | POA: Diagnosis not present

## 2015-10-27 DIAGNOSIS — Z23 Encounter for immunization: Secondary | ICD-10-CM | POA: Diagnosis not present

## 2015-10-27 DIAGNOSIS — Z719 Counseling, unspecified: Secondary | ICD-10-CM | POA: Diagnosis not present

## 2015-10-27 DIAGNOSIS — F419 Anxiety disorder, unspecified: Secondary | ICD-10-CM | POA: Diagnosis not present

## 2015-10-27 DIAGNOSIS — G894 Chronic pain syndrome: Secondary | ICD-10-CM | POA: Diagnosis not present

## 2015-10-27 DIAGNOSIS — E782 Mixed hyperlipidemia: Secondary | ICD-10-CM | POA: Diagnosis not present

## 2015-10-27 DIAGNOSIS — E039 Hypothyroidism, unspecified: Secondary | ICD-10-CM | POA: Diagnosis not present

## 2015-11-10 ENCOUNTER — Emergency Department (HOSPITAL_COMMUNITY)
Admission: EM | Admit: 2015-11-10 | Discharge: 2015-11-10 | Disposition: A | Discharge status: Home / Self Care | Payer: Medicare Other | Attending: Dermatology | Admitting: Dermatology

## 2015-11-10 ENCOUNTER — Encounter (HOSPITAL_COMMUNITY): Payer: Self-pay | Admitting: Emergency Medicine

## 2015-11-10 DIAGNOSIS — R109 Unspecified abdominal pain: Secondary | ICD-10-CM | POA: Insufficient documentation

## 2015-11-10 DIAGNOSIS — F17210 Nicotine dependence, cigarettes, uncomplicated: Secondary | ICD-10-CM | POA: Diagnosis not present

## 2015-11-10 DIAGNOSIS — I10 Essential (primary) hypertension: Secondary | ICD-10-CM | POA: Insufficient documentation

## 2015-11-10 DIAGNOSIS — Z5321 Procedure and treatment not carried out due to patient leaving prior to being seen by health care provider: Secondary | ICD-10-CM | POA: Diagnosis not present

## 2015-11-10 NOTE — ED Triage Notes (Signed)
Pt reports she was treated for a UTI approx 1 week ago. This am started having abdominal pain with radiation to her back. Pt denies fever, no emesis. Pt reports intermittent diarrhea x 1 month.

## 2015-11-10 NOTE — ED Notes (Signed)
No answer in waiting areas.

## 2015-11-12 ENCOUNTER — Encounter (INDEPENDENT_AMBULATORY_CARE_PROVIDER_SITE_OTHER): Payer: Self-pay | Admitting: *Deleted

## 2015-11-24 DIAGNOSIS — F17210 Nicotine dependence, cigarettes, uncomplicated: Secondary | ICD-10-CM | POA: Diagnosis not present

## 2015-11-24 DIAGNOSIS — M1380 Other specified arthritis, unspecified site: Secondary | ICD-10-CM | POA: Diagnosis not present

## 2015-11-24 DIAGNOSIS — M3390 Dermatopolymyositis, unspecified, organ involvement unspecified: Secondary | ICD-10-CM | POA: Diagnosis not present

## 2015-11-24 DIAGNOSIS — R531 Weakness: Secondary | ICD-10-CM | POA: Diagnosis not present

## 2016-01-07 DIAGNOSIS — R208 Other disturbances of skin sensation: Secondary | ICD-10-CM | POA: Diagnosis not present

## 2016-01-07 DIAGNOSIS — R531 Weakness: Secondary | ICD-10-CM | POA: Diagnosis not present

## 2016-01-07 DIAGNOSIS — R200 Anesthesia of skin: Secondary | ICD-10-CM | POA: Diagnosis not present

## 2016-01-07 DIAGNOSIS — M3390 Dermatopolymyositis, unspecified, organ involvement unspecified: Secondary | ICD-10-CM | POA: Diagnosis not present

## 2016-01-07 DIAGNOSIS — M6281 Muscle weakness (generalized): Secondary | ICD-10-CM | POA: Diagnosis not present

## 2016-01-07 DIAGNOSIS — R202 Paresthesia of skin: Secondary | ICD-10-CM | POA: Diagnosis not present

## 2016-01-18 DIAGNOSIS — F909 Attention-deficit hyperactivity disorder, unspecified type: Secondary | ICD-10-CM | POA: Diagnosis not present

## 2016-01-18 DIAGNOSIS — F419 Anxiety disorder, unspecified: Secondary | ICD-10-CM | POA: Diagnosis not present

## 2016-01-18 DIAGNOSIS — C865 Angioimmunoblastic T-cell lymphoma: Secondary | ICD-10-CM | POA: Diagnosis not present

## 2016-01-18 DIAGNOSIS — G894 Chronic pain syndrome: Secondary | ICD-10-CM | POA: Diagnosis not present

## 2016-01-18 DIAGNOSIS — Z6822 Body mass index (BMI) 22.0-22.9, adult: Secondary | ICD-10-CM | POA: Diagnosis not present

## 2016-01-18 DIAGNOSIS — Z23 Encounter for immunization: Secondary | ICD-10-CM | POA: Diagnosis not present

## 2016-01-18 DIAGNOSIS — M3390 Dermatopolymyositis, unspecified, organ involvement unspecified: Secondary | ICD-10-CM | POA: Diagnosis not present

## 2016-01-18 DIAGNOSIS — K219 Gastro-esophageal reflux disease without esophagitis: Secondary | ICD-10-CM | POA: Diagnosis not present

## 2016-01-18 DIAGNOSIS — I10 Essential (primary) hypertension: Secondary | ICD-10-CM | POA: Diagnosis not present

## 2016-02-08 DIAGNOSIS — R945 Abnormal results of liver function studies: Secondary | ICD-10-CM | POA: Diagnosis not present

## 2016-02-08 DIAGNOSIS — Z1389 Encounter for screening for other disorder: Secondary | ICD-10-CM | POA: Diagnosis not present

## 2016-02-08 DIAGNOSIS — Z23 Encounter for immunization: Secondary | ICD-10-CM | POA: Diagnosis not present

## 2016-02-24 ENCOUNTER — Encounter (INDEPENDENT_AMBULATORY_CARE_PROVIDER_SITE_OTHER): Payer: Self-pay | Admitting: Internal Medicine

## 2016-03-20 ENCOUNTER — Encounter (HOSPITAL_COMMUNITY): Payer: Medicare Other | Attending: Hematology & Oncology | Admitting: Hematology & Oncology

## 2016-03-22 ENCOUNTER — Ambulatory Visit (INDEPENDENT_AMBULATORY_CARE_PROVIDER_SITE_OTHER): Payer: Medicare Other | Admitting: Internal Medicine

## 2016-03-22 ENCOUNTER — Encounter (INDEPENDENT_AMBULATORY_CARE_PROVIDER_SITE_OTHER): Payer: Self-pay | Admitting: *Deleted

## 2016-03-22 ENCOUNTER — Encounter (INDEPENDENT_AMBULATORY_CARE_PROVIDER_SITE_OTHER): Payer: Self-pay | Admitting: Internal Medicine

## 2016-03-22 VITALS — BP 110/80 | HR 80 | Temp 98.1°F | Ht 67.0 in | Wt 153.3 lb

## 2016-03-22 DIAGNOSIS — R748 Abnormal levels of other serum enzymes: Secondary | ICD-10-CM

## 2016-03-22 DIAGNOSIS — K219 Gastro-esophageal reflux disease without esophagitis: Secondary | ICD-10-CM | POA: Diagnosis not present

## 2016-03-22 MED ORDER — PANTOPRAZOLE SODIUM 40 MG PO TBEC: 40.0000 mg | DELAYED_RELEASE_TABLET | Freq: Every day | ORAL | 5 refills | Status: DC

## 2016-03-22 NOTE — Patient Instructions (Signed)
OV in 3 months. 

## 2016-03-22 NOTE — Progress Notes (Signed)
Subjective:    Patient ID: Kathryn Austin, female    DOB: 24-May-1975, 41 y.o.   MRN: [id number]  HPI Referred by Dr. Gerarda Fraction for elevated liver enzymes. She takes Methotrexate for Dermatomyositis. Has been on Methotrexate for about 4 yrs. She is not on any new medication. She tells me she sometimes has bloating. She says her lymph nodes will swell.  Her appetite is good. She has gained weight. She says lately she has ate less than usual but has gained weight. Usually has a BM daily. She says has soon has she eats, she will have a BM. Stools are usually loose. Sometimes she does have constipation. No melena or BRRB.   02/08/2016 ALP 93, AST 90, ALT 78 03/08/2015 ALP 74, AST 16, ALT 78 12/21/2017Hepatitis A,B, C are negative.   01/29/2013 EGD and colonoscopy:   Indications:  Patient is 41 year old Caucasian female who presents with recurrent nausea anorexia weight loss and irregular bowel movements. Lately she has been constipated. She denies melena or rectal bleeding. 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 She is undergoing diagnostic EGD and colonoscopy.            EGD findings; Mild changes of reflux esophagitis limited to GE junction. Nonerosive antral gastritis.  Colonoscopy findings; Examination performed to cecum. 4 mm rectal polyp ablated via cold biopsy. Small external hemorrhoids.                                                                                                                 Review of Systems Past Medical History:  Diagnosis Date  . Active smoker 2014  . Anxiety   . Dermatomyositis ([place])   . GERD (gastroesophageal reflux disease)   . Hypertension   . Neuromuscular disorder ([place])    dramata myocytis    Past Surgical History:  Procedure Laterality Date  . ABDOMINAL HYSTERECTOMY N/A 09/04/2012   Procedure: HYSTERECTOMY ABDOMINAL;  Surgeon: Florian Buff, MD;  Location: [facility];  Service:  Gynecology;  Laterality: N/A;  . CESAREAN SECTION     x2  . CHOLECYSTECTOMY  03/31/2011   Procedure: LAPAROSCOPIC CHOLECYSTECTOMY;  Surgeon: Jamesetta So, MD;  Location: [facility];  Service: General;  Laterality: N/A;  . COLONOSCOPY WITH ESOPHAGOGASTRODUODENOSCOPY (EGD) N/A 01/29/2013   Procedure: COLONOSCOPY WITH ESOPHAGOGASTRODUODENOSCOPY (EGD);  Surgeon: Rogene Houston, MD;  Location: [facility];  Service: Endoscopy;  Laterality: N/A;  1200-moved to 1250 Ann to notify pt  . DILATION AND CURETTAGE OF UTERUS    . SALPINGOOPHORECTOMY Bilateral 09/04/2012   Procedure: BILATERAL SALPINGO OOPHORECTOMY;  Surgeon: Florian Buff, MD;  Location: [facility];  Service: Gynecology;  Laterality: Bilateral;  . TONSILLECTOMY    . TUBAL LIGATION      Allergies  Allergen Reactions  . Codeine Nausea And Vomiting and Other (See Comments)    Shortness of breath    Current Outpatient Prescriptions on File Prior to Visit  Medication Sig Dispense Refill  . ALPRAZolam (  XANAX) 0.5 MG tablet Take 0.5 mg by mouth 3 (three) times daily as needed for sleep or anxiety. anxiety    . amphetamine-dextroamphetamine (ADDERALL) 20 MG tablet Take 20 mg by mouth 2 (two) times daily.   0  . estradiol (ESTRACE) 2 MG tablet TAKE 1 TABLET (2 MG TOTAL) BY MOUTH DAILY. 30 tablet 11  . folic acid (FOLVITE) 1 MG tablet Take 1 mg by mouth daily.    Marland Kitchen HYDROcodone-acetaminophen (NORCO/VICODIN) 5-325 MG per tablet Take 1 tablet by mouth every 6 (six) hours as needed for moderate pain.    . methotrexate (RHEUMATREX) 2.5 MG tablet Take 20 mg by mouth once a week. Caution:Chemotherapy. Protect from light. Takes on Wednesday     No current facility-administered medications on file prior to visit.        Objective:   Physical Exam Blood pressure 110/80, pulse 80, temperature 98.1 F (36.7 C), height 5\' 7"  (1.702 m), weight 153 lb 4.8 oz (69.5 kg), last menstrual period 08/30/2012. Alert and oriented. Skin warm and dry. Oral mucosa is  moist.   . Sclera anicteric, conjunctivae is pink. Thyroid not enlarged. No cervical lymphadenopathy. Lungs clear. Heart regular rate and rhythm.  Abdomen is soft. Bowel sounds are positive. No hepatomegaly. No abdominal masses felt. No tenderness.  No edema to lower extremities.          Assessment & Plan:  Elevated liver enzymes. Am going to repeat Hepatic function and get an [place] RUQ GERD: Protonix 40mg  daily.

## 2016-03-23 LAB — HEPATIC FUNCTION PANEL
ALK PHOS: 81 U/L (ref 33–115)
ALT: 21 U/L (ref 6–29)
AST: 20 U/L (ref 10–30)
Albumin: 3.9 g/dL (ref 3.6–5.1)
BILIRUBIN DIRECT: 0.1 mg/dL (ref <=0.2)
BILIRUBIN INDIRECT: 0.2 mg/dL (ref 0.2–1.2)
Total Bilirubin: 0.3 mg/dL (ref 0.2–1.2)
Total Protein: 6.5 g/dL (ref 6.1–8.1)

## 2016-03-27 ENCOUNTER — Other Ambulatory Visit (INDEPENDENT_AMBULATORY_CARE_PROVIDER_SITE_OTHER): Payer: Self-pay | Admitting: *Deleted

## 2016-03-27 DIAGNOSIS — R748 Abnormal levels of other serum enzymes: Secondary | ICD-10-CM

## 2016-03-29 NOTE — Progress Notes (Signed)
This encounter was created in error - please disregard.

## 2016-03-30 ENCOUNTER — Ambulatory Visit (HOSPITAL_COMMUNITY): Admission: RE | Admit: 2016-03-30 | Payer: Medicare Other | Source: Ambulatory Visit

## 2016-03-31 DIAGNOSIS — Z1389 Encounter for screening for other disorder: Secondary | ICD-10-CM | POA: Diagnosis not present

## 2016-03-31 DIAGNOSIS — F419 Anxiety disorder, unspecified: Secondary | ICD-10-CM | POA: Diagnosis not present

## 2016-03-31 DIAGNOSIS — F330 Major depressive disorder, recurrent, mild: Secondary | ICD-10-CM | POA: Diagnosis not present

## 2016-03-31 DIAGNOSIS — K219 Gastro-esophageal reflux disease without esophagitis: Secondary | ICD-10-CM | POA: Diagnosis not present

## 2016-03-31 DIAGNOSIS — Z6823 Body mass index (BMI) 23.0-23.9, adult: Secondary | ICD-10-CM | POA: Diagnosis not present

## 2016-03-31 DIAGNOSIS — M2550 Pain in unspecified joint: Secondary | ICD-10-CM | POA: Diagnosis not present

## 2016-03-31 DIAGNOSIS — I10 Essential (primary) hypertension: Secondary | ICD-10-CM | POA: Diagnosis not present

## 2016-03-31 DIAGNOSIS — F329 Major depressive disorder, single episode, unspecified: Secondary | ICD-10-CM | POA: Diagnosis not present

## 2016-03-31 DIAGNOSIS — G894 Chronic pain syndrome: Secondary | ICD-10-CM | POA: Diagnosis not present

## 2016-03-31 DIAGNOSIS — M3390 Dermatopolymyositis, unspecified, organ involvement unspecified: Secondary | ICD-10-CM | POA: Diagnosis not present

## 2016-03-31 DIAGNOSIS — F909 Attention-deficit hyperactivity disorder, unspecified type: Secondary | ICD-10-CM | POA: Diagnosis not present

## 2016-04-17 DIAGNOSIS — F419 Anxiety disorder, unspecified: Secondary | ICD-10-CM | POA: Diagnosis not present

## 2016-04-17 DIAGNOSIS — F329 Major depressive disorder, single episode, unspecified: Secondary | ICD-10-CM | POA: Diagnosis not present

## 2016-04-17 DIAGNOSIS — F17210 Nicotine dependence, cigarettes, uncomplicated: Secondary | ICD-10-CM | POA: Diagnosis not present

## 2016-04-17 DIAGNOSIS — E119 Type 2 diabetes mellitus without complications: Secondary | ICD-10-CM | POA: Diagnosis not present

## 2016-04-17 DIAGNOSIS — M3390 Dermatopolymyositis, unspecified, organ involvement unspecified: Secondary | ICD-10-CM | POA: Diagnosis not present

## 2016-04-17 DIAGNOSIS — Z79899 Other long term (current) drug therapy: Secondary | ICD-10-CM | POA: Diagnosis not present

## 2016-04-17 DIAGNOSIS — E559 Vitamin D deficiency, unspecified: Secondary | ICD-10-CM | POA: Diagnosis not present

## 2016-04-27 ENCOUNTER — Encounter (INDEPENDENT_AMBULATORY_CARE_PROVIDER_SITE_OTHER): Payer: Self-pay

## 2016-05-02 ENCOUNTER — Ambulatory Visit (HOSPITAL_COMMUNITY): Payer: Medicare Other | Admitting: Oncology

## 2016-05-12 ENCOUNTER — Encounter (HOSPITAL_COMMUNITY): Payer: Medicare Other | Attending: Hematology & Oncology | Admitting: Hematology

## 2016-05-12 ENCOUNTER — Encounter (HOSPITAL_COMMUNITY): Payer: Self-pay

## 2016-05-12 ENCOUNTER — Encounter (HOSPITAL_COMMUNITY): Payer: Medicare Other

## 2016-05-12 VITALS — BP 130/63 | HR 92 | Temp 98.2°F | Resp 20 | Ht 67.5 in | Wt 157.0 lb

## 2016-05-12 DIAGNOSIS — R591 Generalized enlarged lymph nodes: Secondary | ICD-10-CM | POA: Insufficient documentation

## 2016-05-12 DIAGNOSIS — M6281 Muscle weakness (generalized): Secondary | ICD-10-CM | POA: Diagnosis not present

## 2016-05-12 DIAGNOSIS — Z720 Tobacco use: Secondary | ICD-10-CM | POA: Diagnosis not present

## 2016-05-12 DIAGNOSIS — M3390 Dermatopolymyositis, unspecified, organ involvement unspecified: Secondary | ICD-10-CM

## 2016-05-12 LAB — CBC WITH DIFFERENTIAL/PLATELET
BASOS ABS: 0 10*3/uL (ref 0.0–0.1)
BASOS PCT: 0 %
EOS ABS: 0.1 10*3/uL (ref 0.0–0.7)
EOS PCT: 1 %
HCT: 40.7 % (ref 36.0–46.0)
Hemoglobin: 14.1 g/dL (ref 12.0–15.0)
Lymphocytes Relative: 24 %
Lymphs Abs: 2.8 10*3/uL (ref 0.7–4.0)
MCH: 33.6 pg (ref 26.0–34.0)
MCHC: 34.6 g/dL (ref 30.0–36.0)
MCV: 96.9 fL (ref 78.0–100.0)
MONO ABS: 0.5 10*3/uL (ref 0.1–1.0)
MONOS PCT: 5 %
Neutro Abs: 8.1 10*3/uL — ABNORMAL HIGH (ref 1.7–7.7)
Neutrophils Relative %: 70 %
PLATELETS: 314 10*3/uL (ref 150–400)
RBC: 4.2 MIL/uL (ref 3.87–5.11)
RDW: 15.5 % (ref 11.5–15.5)
WBC: 11.6 10*3/uL — ABNORMAL HIGH (ref 4.0–10.5)

## 2016-05-12 LAB — COMPREHENSIVE METABOLIC PANEL
ALBUMIN: 4.3 g/dL (ref 3.5–5.0)
ALT: 21 U/L (ref 14–54)
ANION GAP: 8 (ref 5–15)
AST: 22 U/L (ref 15–41)
Alkaline Phosphatase: 70 U/L (ref 38–126)
BILIRUBIN TOTAL: 0.3 mg/dL (ref 0.3–1.2)
BUN: 10 mg/dL (ref 6–20)
CHLORIDE: 106 mmol/L (ref 101–111)
CO2: 25 mmol/L (ref 22–32)
Calcium: 9.3 mg/dL (ref 8.9–10.3)
Creatinine, Ser: 0.66 mg/dL (ref 0.44–1.00)
GFR calc Af Amer: >60 mL/min (ref >60)
GFR calc non Af Amer: >60 mL/min (ref >60)
GLUCOSE: 93 mg/dL (ref 65–99)
POTASSIUM: 3.8 mmol/L (ref 3.5–5.1)
SODIUM: 139 mmol/L (ref 135–145)
TOTAL PROTEIN: 7.3 g/dL (ref 6.5–8.1)

## 2016-05-12 LAB — SEDIMENTATION RATE: Sed Rate: 3 mm/hr (ref 0–22)

## 2016-05-12 LAB — LACTATE DEHYDROGENASE: LDH: 165 U/L (ref 98–192)

## 2016-05-12 NOTE — Patient Instructions (Addendum)
[place] at [facility] Discharge Instructions  RECOMMENDATIONS MADE BY THE CONSULTANT AND ANY TEST RESULTS WILL BE SENT TO YOUR REFERRING PHYSICIAN.  Labs today  CT Chest abdomen pelvis in 2 weeks  Follow up with Dr. Talbert Cage in 2-3 weeks after CT scan   Thank you for choosing [place] at [facility] to provide your oncology and hematology care.  To afford each patient quality time with our provider, please arrive at least 15 minutes before your scheduled appointment time.    If you have a lab appointment with the [facility] please come in thru the  Main Entrance and check in at the main information desk  You need to re-schedule your appointment should you arrive 10 or more minutes late.  We strive to give you quality time with our providers, and arriving late affects you and other patients whose appointments are after yours.  Also, if you no show three or more times for appointments you may be dismissed from the clinic at the providers discretion.     Again, thank you for choosing [facility]  Our hope is that these requests will decrease the amount of time that you wait before being seen by our physicians.       _____________________________________________________________  Should you have questions after your visit to [facility], please contact our office at [phone] between the hours of 8:30 a.m. and 4:30 p.m.  Voicemails left after 4:30 p.m. will not be returned until the following business day.  For prescription refill requests, have your pharmacy contact our office.       Resources For Cancer Patients and their Caregivers ? American Cancer Society: Can assist with transportation, wigs, general needs, runs Look Good Feel Better.        (206)244-2696 ? Cancer Care: Provides financial assistance, online support groups, medication/co-pay assistance.  1-800-813-HOPE 228-302-8381) ? [place] Assists [place] cancer patients and their families through emotional , educational and financial support.  902-709-1276 ? [place] DSS Where to apply for food stamps, Medicaid and utility assistance. 4358475809 ? [facility]: Transportation to medical appointments. (270)431-0882 ? Social Security Administration: May apply for disability if have a Stage IV cancer. 304-426-4267 667-141-8501 ? [employer], Disability and Transit Services: Assists with nutrition, care and transit needs. [place] Support Programs: @10RELATIVEDAYS @ > Cancer Support Group  2nd Tuesday of the month 1pm-2pm, Journey Room  > Creative Journey  3rd Tuesday of the month 1130am-1pm, Journey Room  > Look Good Feel Better  1st Wednesday of the month 10am-12 noon, Journey Room (Call [place] to register [phone])

## 2016-05-13 NOTE — [place] Notes (Signed)
[facility]    HEMATOLOGY/ONCOLOGY CONSULTATION NOTE  Date of Service: 05/13/2016  Patient Care Team: Redmond School, MD as PCP - General (Internal Medicine) Florian Buff, MD as Consulting Physician (Obstetrics and Gynecology) Rogene Houston, MD as Consulting Physician (Gastroenterology) Aviva Signs, MD as Consulting Physician (General Surgery) Cory Munch, PA-C as Physician Assistant (Physician Assistant) Dennard Nip, MD as Referring Physician (Internal Medicine) Sharilyn Sites, MD as Consulting Physician (Family Medicine) Sherlyn Lick, MD (Dermatology) Unice Bailey, MD as Consulting Physician (Rheumatology)  CHIEF COMPLAINTS/PURPOSE OF CONSULTATION:  Lymphadenopathy  HISTORY OF PRESENTING ILLNESS:   Kathryn Austin is a wonderful 41 y.o. female who has been referred to [place] by Dr .Glo Herring., MD  for evaluation and management of lymphadenopathy.  Patient has a history of dermatomyositis, depression, anxiety, active smoker, hypertension who has been referred to [place] for cervical lymphadenopathy.  Patient was diagnosed with dermatomyositis when she presented with joint pains and a skin rash in 2013 and had a biopsy of her skin rash. She was followed by Dr. Shiela Mayer from dermatology at [facility] . She notes that she has also had some muscle weakness with walking upstairs and carrying heavy things area apparently had pulmonary function testing a couple of years ago which was within normal limits. She reports that she has had CT scans of the chest abdomen pelvis and endoscopy and colonoscopy in 2015 for cancer screening which were unrevealing. She has had some issues with visual blurring that were thought to be possibly related to her Plaquenil use. She has been off Plaquenil now.  She has been getting treated with methotrexate 20 mg weekly and was also previously on Plaquenil 200 mg by mouth twice a day. She notes that the skin rash improved with methotrexate but  she still has muscle weakness.  She notes that she has cut down her smoking from 1 pack per day to half a pack per day. Still has polyarthralgia/arthritis.  Prior labs: ana1:160, neg smith, rnp, ssa, ssb, jo1, rf,  CXR 2017: no active pulm disease Triceps biopsy 2016: Skeletal muscle with mild nonspecific atrophic changes. Skin bx 2014: Superficial perivascular dermatitis with focal vacuolar  Has recently established rheumatology cares at [facility] with Starr Lake, MD .  Patient reports she has intermittent swelling of her neck and also showed me a picture with what appears to be some neck swelling which has since improved and nearly resolved. She notes that she can feel in left lymph nodes which come and go in her neck.  Her primary care physician is Dr. Gerarda Fraction who has been managing her anxiety ADHD and a chronic pain issues as well as depression.   Patient notes minimal recent fevers or chills. Currently has no other palpable lymph nodes other than some of the neck. Notes some weight loss but is not sure what is the cause.  Reports stress with her husband undergoing major back surgery.   MEDICAL HISTORY:  Past Medical History:  Diagnosis Date  . Active smoker 2014  . Anxiety   . Dermatomyositis ([place])   . GERD (gastroesophageal reflux disease)   . Hypertension   . Neuromuscular disorder ([place])    Dermatomyositis     SURGICAL HISTORY: Past Surgical History:  Procedure Laterality Date  . ABDOMINAL HYSTERECTOMY N/A 09/04/2012   Procedure: HYSTERECTOMY ABDOMINAL;  Surgeon: Florian Buff, MD;  Location: [facility] ORS;  Service: Gynecology;  Laterality: N/A;  . CESAREAN SECTION  x2  . CHOLECYSTECTOMY  03/31/2011   Procedure: LAPAROSCOPIC CHOLECYSTECTOMY;  Surgeon: Jamesetta So, MD;  Location: [facility] ORS;  Service: General;  Laterality: N/A;  . COLONOSCOPY WITH ESOPHAGOGASTRODUODENOSCOPY (EGD) N/A 01/29/2013   Procedure: COLONOSCOPY WITH ESOPHAGOGASTRODUODENOSCOPY (EGD);   Surgeon: Rogene Houston, MD;  Location: [facility] ENDO SUITE;  Service: Endoscopy;  Laterality: N/A;  1200-moved to 1250 Ann to notify pt  . DILATION AND CURETTAGE OF UTERUS    . SALPINGOOPHORECTOMY Bilateral 09/04/2012   Procedure: BILATERAL SALPINGO OOPHORECTOMY;  Surgeon: Florian Buff, MD;  Location: [facility];  Service: Gynecology;  Laterality: Bilateral;  . TONSILLECTOMY    . TUBAL LIGATION      SOCIAL HISTORY: Social History   Social History  . Marital status: Married    Spouse name: N/A  . Number of children: N/A  . Years of education: N/A   Occupational History  . Not on file.   Social History Main Topics  . Smoking status: Current Every Day Smoker    Packs/day: 1.00    Years: 30.00    Types: Cigarettes  . Smokeless tobacco: Never Used     Comment: currently smoking .5ppd  . Alcohol use No  . Drug use: No  . Sexual activity: Not Currently    Birth control/ protection: Surgical   Other Topics Concern  . Not on file   Social History Narrative  . No narrative on file    FAMILY HISTORY: Family History  Problem Relation Age of Onset  . Diabetes Mother   . Hypertension Mother   . COPD Mother   . Heart disease Mother   . Stroke Mother   . Cancer Father   . Anesthesia problems Neg Hx   . Hypotension Neg Hx   . Malignant hyperthermia Neg Hx   . Pseudochol deficiency Neg Hx    Mother had autoimmune disease hypertension diabetes heart failure Father had CLL and had a Richter's transformation and died at age 14 years.  ALLERGIES:  is allergic to codeine.  MEDICATIONS:  Current Outpatient Prescriptions  Medication Sig Dispense Refill  . ALPRAZolam (XANAX) 0.5 MG tablet Take 0.5 mg by mouth 3 (three) times daily as needed for sleep or anxiety. anxiety    . amphetamine-dextroamphetamine (ADDERALL) 20 MG tablet Take 20 mg by mouth 2 (two) times daily.   0  . aspirin 81 MG chewable tablet Chew by mouth daily.    Marland Kitchen buPROPion (WELLBUTRIN SR) 150 MG 12 hr tablet Take 150 mg  by mouth daily.    Marland Kitchen estradiol (ESTRACE) 2 MG tablet TAKE 1 TABLET (2 MG TOTAL) BY MOUTH DAILY. 30 tablet 11  . folic acid (FOLVITE) 1 MG tablet Take 1 mg by mouth daily.    Marland Kitchen HYDROcodone-acetaminophen (NORCO/VICODIN) 5-325 MG per tablet Take 1 tablet by mouth every 6 (six) hours as needed for moderate pain.    . methotrexate (RHEUMATREX) 2.5 MG tablet Take 20 mg by mouth once a week. Caution:Chemotherapy. Protect from light. Takes on Wednesday    . mirtazapine (REMERON) 30 MG tablet Take 30 mg by mouth at bedtime.    . Multiple Vitamin (MULTIVITAMIN) tablet Take 1 tablet by mouth daily.    . ranitidine (ZANTAC) 150 MG capsule Take 150 mg by mouth 2 (two) times daily.     No current facility-administered medications for this visit.     REVIEW OF SYSTEMS:    10 Point review of Systems was done is negative except as noted above.  PHYSICAL EXAMINATION:  ECOG PERFORMANCE STATUS: 1 - Symptomatic but completely ambulatory  . Vitals:   05/12/16 1301  BP: 130/63  Pulse: 92  Resp: 20  Temp: 98.2 F (36.8 C)   Filed Weights   05/12/16 1343  Weight: 157 lb (71.2 kg)   .Body mass index is 24.23 kg/m.  GENERAL:alert, in no acute distress and comfortable SKIN: Erythematous rash on the neck right upper chest and some areas of the upper extremity. EYES: conjunctiva are pink and non-injected, sclera anicteric OROPHARYNX: MMM, no exudates, no oropharyngeal erythema or ulceration NECK: supple, no JVD LYMPH:  Small bilateral cervical lymph nodes ,no palpable lymphadenopathy in the axillary or inguinal regions LUNGS: clear to auscultation b/l with normal respiratory effort HEART: regular rate & rhythm ABDOMEN:  normoactive bowel sounds , non tender, not distended. Extremity: no pedal edema PSYCH: alert & oriented x 3 with fluent speech NEURO: no focal motor/sensory deficits  LABORATORY DATA:  I have reviewed the data as listed  . CBC Latest Ref Rng & Units 05/12/2016 08/19/2013 11/05/2012    WBC 4.0 - 10.5 K/uL 11.6(H) 10.3 6.9  Hemoglobin 12.0 - 15.0 g/dL 14.1 14.2 13.3  Hematocrit 36.0 - 46.0 % 40.7 40.4 38.8  Platelets 150 - 400 K/uL 314 279 273    . CMP Latest Ref Rng & Units 05/12/2016 03/22/2016 08/19/2013  Glucose 65 - 99 mg/dL 93 - 83  BUN 6 - 20 mg/dL 10 - 8  Creatinine 0.44 - 1.00 mg/dL 0.66 - 0.70  Sodium 135 - 145 mmol/L 139 - 142  Potassium 3.5 - 5.1 mmol/L 3.8 - 4.3  Chloride 101 - 111 mmol/L 106 - 105  CO2 22 - 32 mmol/L 25 - 25  Calcium 8.9 - 10.3 mg/dL 9.3 - 9.2  Total Protein 6.5 - 8.1 g/dL 7.3 6.5 7.2  Total Bilirubin 0.3 - 1.2 mg/dL 0.3 0.3 0.3  Alkaline Phos 38 - 126 U/L 70 81 84  AST 15 - 41 U/L 22 20 17   ALT 14 - 54 U/L 21 21 15    . Lab Results  Component Value Date   LDH 165 05/12/2016   Sed rate 3  RADIOGRAPHIC STUDIES: I have personally reviewed the radiological images as listed and agreed with the findings in the report. No results found.  ASSESSMENT & PLAN:   41 yo caucasian female referred for lymphadenopathy  1) Borderline cervical lymphadenopathy. Patient showed me a photo with significant neck swelling taken a few weeks ago and notes that he LN were quite swollen then. LNadenopathy is small could certainly be related to her dermatomyositis. However this condition also increase his physical symptom lymphomas and the patient is immunosuppressed with methotrexate use for several years. Notes unquantified weight loss. No overt fevers chills or night sweats. There is some outside paperwork suggesting elevation of paraproteins but no available lab evidence this. Plan -CBC and CMP were drawn today and are unremarkable -LDH and sedimentation rate are within normal limits -Small palpable cervical lymphadenopathy but no other palpable lymphadenopathy noted. -We'll get a CT of the chest abdomen pelvis to rule out any significant lymphadenopathy. 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 -Initial cancer screening with CT chest abdomen pelvis endoscopy and colonoscopy in 2015 was unrevealing  -We are getting a CT chest abdomen pelvis to evaluate for any significant lymphadenopathy or other lesions  -Continue age-appropriate cancer screening. Typically the cancer  resistance or drop off in about 5 years after diagnosis and his highest in the 1st year or so after diagnosis. -gi f/u as per their recommended -cervical cancer screening -smoking cessation  3) Smoker - counseled on smoking cessation especially with increased cancer risk with her dermatomyositis   Return to care 2-3 weeks with CT chest/abd/pelvis  All of the patients questions were answered with apparent satisfaction. The patient knows to call the clinic with any problems, questions or concerns.  I spent 45 minutes counseling the patient face to face. The total time spent in the appointment was 60 minutes and more than 50% was on counseling and direct patient cares.    Sullivan Lone MD [place] AAHIVMS [facility] Hematology/Oncology Physician [facility]  (Office):       [phone] (Work cell):  [phone] (Fax):           [phone]

## 2016-05-15 LAB — KAPPA/LAMBDA LIGHT CHAINS
KAPPA FREE LGHT CHN: 14.3 mg/L (ref 3.3–19.4)
Kappa, lambda light chain ratio: 0.61 (ref 0.26–1.65)
LAMDA FREE LIGHT CHAINS: 23.3 mg/L (ref 5.7–26.3)

## 2016-05-16 LAB — MULTIPLE MYELOMA PANEL, SERUM
ALBUMIN/GLOB SERPL: 1.4 (ref 0.7–1.7)
ALPHA 1: 0.3 g/dL (ref 0.0–0.4)
ALPHA2 GLOB SERPL ELPH-MCNC: 0.7 g/dL (ref 0.4–1.0)
Albumin SerPl Elph-Mcnc: 4 g/dL (ref 2.9–4.4)
B-Globulin SerPl Elph-Mcnc: 1.2 g/dL (ref 0.7–1.3)
GLOBULIN, TOTAL: 3 g/dL (ref 2.2–3.9)
Gamma Glob SerPl Elph-Mcnc: 0.8 g/dL (ref 0.4–1.8)
IGG (IMMUNOGLOBIN G), SERUM: 730 mg/dL (ref 700–1600)
IgA: 163 mg/dL (ref 87–352)
IgM, Serum: 58 mg/dL (ref 26–217)
Total Protein ELP: 7 g/dL (ref 6.0–8.5)

## 2016-05-22 ENCOUNTER — Other Ambulatory Visit (INDEPENDENT_AMBULATORY_CARE_PROVIDER_SITE_OTHER): Payer: Self-pay | Admitting: *Deleted

## 2016-05-22 ENCOUNTER — Encounter (INDEPENDENT_AMBULATORY_CARE_PROVIDER_SITE_OTHER): Payer: Self-pay | Admitting: *Deleted

## 2016-05-22 DIAGNOSIS — R748 Abnormal levels of other serum enzymes: Secondary | ICD-10-CM

## 2016-05-29 ENCOUNTER — Ambulatory Visit (HOSPITAL_COMMUNITY)
Admission: RE | Admit: 2016-05-29 | Discharge: 2016-05-29 | Disposition: A | Discharge status: Home / Self Care | Payer: Medicare Other | Source: Ambulatory Visit | Attending: Hematology | Admitting: Hematology

## 2016-05-29 DIAGNOSIS — R935 Abnormal findings on diagnostic imaging of other abdominal regions, including retroperitoneum: Secondary | ICD-10-CM | POA: Diagnosis not present

## 2016-05-29 DIAGNOSIS — R591 Generalized enlarged lymph nodes: Secondary | ICD-10-CM | POA: Insufficient documentation

## 2016-05-29 DIAGNOSIS — J439 Emphysema, unspecified: Secondary | ICD-10-CM | POA: Diagnosis not present

## 2016-05-29 MED ORDER — IOPAMIDOL (ISOVUE-300) INJECTION 61%
100.0000 mL | Freq: Once | INTRAVENOUS | Status: AC | PRN
  Administered 2016-05-29: 100 mL via INTRAVENOUS

## 2016-06-01 ENCOUNTER — Encounter (HOSPITAL_COMMUNITY): Payer: Self-pay

## 2016-06-01 ENCOUNTER — Encounter (HOSPITAL_COMMUNITY): Payer: Medicare Other | Attending: Hematology & Oncology | Admitting: Oncology

## 2016-06-01 DIAGNOSIS — Z720 Tobacco use: Secondary | ICD-10-CM

## 2016-06-01 DIAGNOSIS — R591 Generalized enlarged lymph nodes: Secondary | ICD-10-CM | POA: Diagnosis not present

## 2016-06-01 NOTE — Progress Notes (Signed)
Marland Kitchen    HEMATOLOGY/ONCOLOGY PROGRESS NOTE  Date of Service: 06/01/2016  Patient Care Team: Redmond School, MD as PCP - General (Internal Medicine) Florian Buff, MD as Consulting Physician (Obstetrics and Gynecology) Rogene Houston, MD as Consulting Physician (Gastroenterology) Aviva Signs, MD as Consulting Physician (General Surgery) Cory Munch, PA-C as Physician Assistant (Physician Assistant) Dennard Nip, MD as Referring Physician (Internal Medicine) Sharilyn Sites, MD as Consulting Physician (Family Medicine) Sherlyn Lick, MD (Dermatology) Unice Bailey, MD as Consulting Physician (Rheumatology)  CHIEF COMPLAINTS/PURPOSE OF CONSULTATION:  Lymphadenopathy  HISTORY OF PRESENTING ILLNESS:   Kathryn Austin is a wonderful 41 y.o. female who has been referred to [place] by Dr .Glo Herring, MD  for evaluation and management of lymphadenopathy.  Patient has a history of dermatomyositis, depression, anxiety, active smoker, hypertension who has been referred to [place] for cervical lymphadenopathy.  Patient was diagnosed with dermatomyositis when she presented with joint pains and a skin rash in 2013 and had a biopsy of her skin rash. She was followed by Dr. Shiela Mayer from dermatology at [facility] . She notes that she has also had some muscle weakness with walking upstairs and carrying heavy things area apparently had pulmonary function testing a couple of years ago which was within normal limits. She reports that she has had CT scans of the chest abdomen pelvis and endoscopy and colonoscopy in 2015 for cancer screening which were unrevealing. She has had some issues with visual blurring that were thought to be possibly related to her Plaquenil use. She has been off Plaquenil now.   [name] presents to the clinic for continued follow up. I personally reviewed and went over labs with the patient.  She notes that the lymph nodes in her neck were all swollen. She  describes the locations as "under my chin, under my arms, and behind my knees."  She notes that she had some problems with her "gums being red". She went to see her dentist and they told her that it was due to having a sinus infection.   She is currently getting treated for her dermatomyositis. She has been getting treated with methotrexate 20 mg weekly and was previously on Plaquenil 200 mg by mouth twice a day.   She reports low back pain that radiates down her legs. She reports associated numbness.   MEDICAL HISTORY:  Past Medical History:  Diagnosis Date  . Active smoker 2014  . Anxiety   . Dermatomyositis ([place])   . GERD (gastroesophageal reflux disease)   . Hypertension   . Neuromuscular disorder ([place])    Dermatomyositis     SURGICAL HISTORY: Past Surgical History:  Procedure Laterality Date  . ABDOMINAL HYSTERECTOMY N/A 09/04/2012   Procedure: HYSTERECTOMY ABDOMINAL;  Surgeon: Florian Buff, MD;  Location: [facility];  Service: Gynecology;  Laterality: N/A;  . CESAREAN SECTION     x2  . CHOLECYSTECTOMY  03/31/2011   Procedure: LAPAROSCOPIC CHOLECYSTECTOMY;  Surgeon: Jamesetta So, MD;  Location: [facility];  Service: General;  Laterality: N/A;  . COLONOSCOPY WITH ESOPHAGOGASTRODUODENOSCOPY (EGD) N/A 01/29/2013   Procedure: COLONOSCOPY WITH ESOPHAGOGASTRODUODENOSCOPY (EGD);  Surgeon: Rogene Houston, MD;  Location[facility];  Service: Endoscopy;  Laterality: N/A;  1200-moved to 1250 Ann to notify pt  . DILATION AND CURETTAGE OF UTERUS    . SALPINGOOPHORECTOMY Bilateral 09/04/2012   Procedure: BILATERAL SALPINGO OOPHORECTOMY;  Surgeon: Florian Buff, MD;  Location: [facility];  Service: Gynecology;  Laterality:  Bilateral;  . TONSILLECTOMY    . TUBAL LIGATION      SOCIAL HISTORY: Social History   Social History  . Marital status: Married    Spouse name: N/A  . Number of children: N/A  . Years of education: N/A   Occupational History  . Not on file.   Social History Main  Topics  . Smoking status: Current Every Day Smoker    Packs/day: 1.00    Years: 30.00    Types: Cigarettes  . Smokeless tobacco: Never Used     Comment: currently smoking .5ppd  . Alcohol use No  . Drug use: No  . Sexual activity: Not Currently    Birth control/ protection: Surgical   Other Topics Concern  . Not on file   Social History Narrative  . No narrative on file    FAMILY HISTORY: Family History  Problem Relation Age of Onset  . Diabetes Mother   . Hypertension Mother   . COPD Mother   . Heart disease Mother   . Stroke Mother   . Cancer Father   . Anesthesia problems Neg Hx   . Hypotension Neg Hx   . Malignant hyperthermia Neg Hx   . Pseudochol deficiency Neg Hx    Mother had autoimmune disease hypertension diabetes heart failure Father had CLL and had a Richter's transformation and died at age 47 years.  ALLERGIES:  is allergic to codeine.  MEDICATIONS:  Current Outpatient Prescriptions  Medication Sig Dispense Refill  . ALPRAZolam (XANAX) 0.5 MG tablet Take 0.5 mg by mouth 3 (three) times daily as needed for sleep or anxiety. anxiety    . amphetamine-dextroamphetamine (ADDERALL) 20 MG tablet Take 20 mg by mouth 2 (two) times daily.   0  . aspirin 81 MG chewable tablet Chew by mouth daily.    Marland Kitchen estradiol (ESTRACE) 2 MG tablet TAKE 1 TABLET (2 MG TOTAL) BY MOUTH DAILY. 30 tablet 11  . folic acid (FOLVITE) 1 MG tablet Take 1 mg by mouth daily.    Marland Kitchen HYDROcodone-acetaminophen (NORCO/VICODIN) 5-325 MG per tablet Take 1 tablet by mouth every 6 (six) hours as needed for moderate pain.    . methotrexate (RHEUMATREX) 2.5 MG tablet Take 20 mg by mouth once a week. Caution:Chemotherapy. Protect from light. Takes on Wednesday    . mirtazapine (REMERON) 30 MG tablet Take 30 mg by mouth at bedtime.    . Multiple Vitamin (MULTIVITAMIN) tablet Take 1 tablet by mouth daily.    . pantoprazole (PROTONIX) 40 MG tablet TAKE 1 TABLET (40 MG TOTAL) BY MOUTH DAILY.  5  .  ranitidine (ZANTAC) 150 MG capsule Take 150 mg by mouth 2 (two) times daily.    . valACYclovir (VALTREX) 1000 MG tablet Take 1,000 mg by mouth 3 (three) times daily.  11   No current facility-administered medications for this visit.     REVIEW OF SYSTEMS:    Review of Systems  Constitutional: Negative.   HENT: Negative.        Erythema of her gums.   Eyes: Negative.   Respiratory: Negative.   Cardiovascular: Negative.   Gastrointestinal: Negative.   Genitourinary: Negative.   Musculoskeletal: Positive for back pain (low back pain that radiates down her legs with associated numbness).  Skin: Negative.   Neurological: Negative.   Endo/Heme/Allergies: Negative.   Psychiatric/Behavioral: Negative.   All other systems reviewed and are negative.   PHYSICAL EXAMINATION: ECOG PERFORMANCE STATUS: 1 - Symptomatic but completely ambulatory  Vitals:  06/01/16 1400  BP: 124/61  Pulse: 82  Resp: 18  Temp: 98.6 F (37 C)   Filed Weights   06/01/16 1400  Weight: 157 lb 3.2 oz (71.3 kg)     Physical Exam  Constitutional: She is oriented to person, place, and time and well-developed, well-nourished, and in no distress.  HENT:  Head: Normocephalic and atraumatic.  Eyes: Conjunctivae and EOM are normal. Pupils are equal, round, and reactive to light.  Neck: Normal range of motion. Neck supple.  Cardiovascular: Normal rate, regular rhythm and normal heart sounds.   Pulmonary/Chest: Effort normal and breath sounds normal.  Abdominal: Soft. Bowel sounds are normal.  Musculoskeletal: Normal range of motion.  Lymphadenopathy:       Head (right side): No submental and no submandibular adenopathy present.       Head (left side): No submental and no submandibular adenopathy present.    She has no cervical adenopathy.  Neurological: She is alert and oriented to person, place, and time. Gait normal.  Skin: Skin is warm and dry.  Nursing note and vitals reviewed.   LABORATORY DATA:  I  have reviewed the data as listed  . CBC Latest Ref Rng & Units 05/12/2016 08/19/2013 11/05/2012  WBC 4.0 - 10.5 K/uL 11.6(H) 10.3 6.9  Hemoglobin 12.0 - 15.0 g/dL 14.1 14.2 13.3  Hematocrit 36.0 - 46.0 % 40.7 40.4 38.8  Platelets 150 - 400 K/uL 314 279 273    . CMP Latest Ref Rng & Units 05/12/2016 03/22/2016 08/19/2013  Glucose 65 - 99 mg/dL 93 - 83  BUN 6 - 20 mg/dL 10 - 8  Creatinine 0.44 - 1.00 mg/dL 0.66 - 0.70  Sodium 135 - 145 mmol/L 139 - 142  Potassium 3.5 - 5.1 mmol/L 3.8 - 4.3  Chloride 101 - 111 mmol/L 106 - 105  CO2 22 - 32 mmol/L 25 - 25  Calcium 8.9 - 10.3 mg/dL 9.3 - 9.2  Total Protein 6.5 - 8.1 g/dL 7.3 6.5 7.2  Total Bilirubin 0.3 - 1.2 mg/dL 0.3 0.3 0.3  Alkaline Phos 38 - 126 U/L 70 81 84  AST 15 - 41 U/L 22 20 17   ALT 14 - 54 U/L 21 21 15    . Lab Results  Component Value Date   LDH 165 05/12/2016   Sed rate 3  RADIOGRAPHIC STUDIES: I have personally reviewed the radiological images as listed and agreed with the findings in the report.  CT CHEST, ABDOMEN, AND PELVIS WITH CONTRAST 05/29/2016  IMPRESSION: No findings suspicious for lymphoma in the chest, abdomen, or pelvis.   ASSESSMENT & PLAN:   40 yo caucasian female referred for lymphadenopathy  1) Borderline cervical lymphadenopathy. Patient showed me a photo with significant neck swelling taken a few weeks ago and notes that he LN were quite swollen then. Suspect her transient lymphadenopathy may be related to her dermatomyositis. No suspicion for underlying lymphoma or other malignant cause since they have all resolved. LDH normal. SPEP normal. Immunoglobulins normal. CT C/A/P was negative.  2) h/o Significant Dermatomyositis -Continue follow-up with dermatology and rheumatology at [facility] .  3) Smoker - counseled on smoking cessation especially with increased cancer risk with her dermatomyositis    Labs and scans reviewed with the patient. Results noted above.  All of her blood  counts and labs were normal except for a slightly elevated WBC. I suspect her mild leukocytosis is likely related to infectious etiology.  I am returning her care over to her PCP  as she does not have any oncological issues.    All of the patients questions were answered with apparent satisfaction. The patient knows to call the clinic with any problems, questions or concerns.  This document serves as a record of services personally performed by Twana First, MD. It was created on her behalf by Shirlean Mylar, a trained medical scribe. The creation of this record is based on the scribe's personal observations and the provider's statements to them. This document has been checked and approved by the attending provider.  I have reviewed the above documentation for accuracy and completeness and I agree with the above.  Twana First, MD.

## 2016-06-01 NOTE — Patient Instructions (Signed)
[place] at [facility] Discharge Instructions  RECOMMENDATIONS MADE BY THE CONSULTANT AND ANY TEST RESULTS WILL BE SENT TO YOUR REFERRING PHYSICIAN.  You were seen today by Dr. Twana First Follow up as needed See Amy up front for appointments   Thank you for choosing [place] at [facility] to provide your oncology and hematology care.  To afford each patient quality time with our provider, please arrive at least 15 minutes before your scheduled appointment time.    If you have a lab appointment with the [place] please come in thru the  Main Entrance and check in at the main information desk  You need to re-schedule your appointment should you arrive 10 or more minutes late.  We strive to give you quality time with our providers, and arriving late affects you and other patients whose appointments are after yours.  Also, if you no show three or more times for appointments you may be dismissed from the clinic at the providers discretion.     Again, thank you for choosing [facility].  Our hope is that these requests will decrease the amount of time that you wait before being seen by our physicians.       _____________________________________________________________  Should you have questions after your visit to [facility], please contact our office at [phone] between the hours of 8:30 a.m. and 4:30 p.m.  Voicemails left after 4:30 p.m. will not be returned until the following business day.  For prescription refill requests, have your pharmacy contact our office.       Resources For Cancer Patients and their Caregivers ? American Cancer Society: Can assist with transportation, wigs, general needs, runs Look Good Feel Better.        864-153-8614 ? Cancer Care: Provides financial assistance, online support groups, medication/co-pay assistance.  1-800-813-HOPE (581)351-1948) ? [employer] Assists [place] cancer patients and their families through emotional , educational and financial support.  520-236-2737 ? [place] DSS Where to apply for food stamps, Medicaid and utility assistance. 321 359 0809 ? [employer]: Transportation to medical appointments. 252-350-8240 ? Social Security Administration: May apply for disability if have a Stage IV cancer. (662)227-9677 (705) 843-2408 ? [employer], Disability and Transit Services: Assists with nutrition, care and transit needs. [place] Support Programs: @10RELATIVEDAYS @ > Cancer Support Group  2nd Tuesday of the month 1pm-2pm, Journey Room  > Creative Journey  3rd Tuesday of the month 1130am-1pm, Journey Room  > Look Good Feel Better  1st Wednesday of the month 10am-12 noon, Journey Room (Call [place] to register [phone])

## 2016-06-20 ENCOUNTER — Ambulatory Visit (INDEPENDENT_AMBULATORY_CARE_PROVIDER_SITE_OTHER): Payer: Medicare Other | Admitting: Internal Medicine

## 2016-06-28 DIAGNOSIS — I10 Essential (primary) hypertension: Secondary | ICD-10-CM | POA: Diagnosis not present

## 2016-06-28 DIAGNOSIS — G894 Chronic pain syndrome: Secondary | ICD-10-CM | POA: Diagnosis not present

## 2016-06-28 DIAGNOSIS — Z6824 Body mass index (BMI) 24.0-24.9, adult: Secondary | ICD-10-CM | POA: Diagnosis not present

## 2016-06-28 DIAGNOSIS — M3390 Dermatopolymyositis, unspecified, organ involvement unspecified: Secondary | ICD-10-CM | POA: Diagnosis not present

## 2016-06-28 DIAGNOSIS — F909 Attention-deficit hyperactivity disorder, unspecified type: Secondary | ICD-10-CM | POA: Diagnosis not present

## 2016-06-28 DIAGNOSIS — Z1389 Encounter for screening for other disorder: Secondary | ICD-10-CM | POA: Diagnosis not present

## 2016-07-20 DIAGNOSIS — Z1231 Encounter for screening mammogram for malignant neoplasm of breast: Secondary | ICD-10-CM | POA: Diagnosis not present

## 2016-07-27 DIAGNOSIS — Z6824 Body mass index (BMI) 24.0-24.9, adult: Secondary | ICD-10-CM | POA: Diagnosis not present

## 2016-07-27 DIAGNOSIS — R350 Frequency of micturition: Secondary | ICD-10-CM | POA: Diagnosis not present

## 2016-07-27 DIAGNOSIS — N342 Other urethritis: Secondary | ICD-10-CM | POA: Diagnosis not present

## 2016-08-15 DIAGNOSIS — J329 Chronic sinusitis, unspecified: Secondary | ICD-10-CM | POA: Diagnosis not present

## 2016-08-15 DIAGNOSIS — F909 Attention-deficit hyperactivity disorder, unspecified type: Secondary | ICD-10-CM | POA: Diagnosis not present

## 2016-08-15 DIAGNOSIS — M3313 Other dermatomyositis without myopathy: Secondary | ICD-10-CM | POA: Diagnosis not present

## 2016-08-15 DIAGNOSIS — K219 Gastro-esophageal reflux disease without esophagitis: Secondary | ICD-10-CM | POA: Diagnosis not present

## 2016-08-15 DIAGNOSIS — Z6825 Body mass index (BMI) 25.0-25.9, adult: Secondary | ICD-10-CM | POA: Diagnosis not present

## 2016-09-12 DIAGNOSIS — Z5181 Encounter for therapeutic drug level monitoring: Secondary | ICD-10-CM | POA: Diagnosis not present

## 2016-09-12 DIAGNOSIS — M1380 Other specified arthritis, unspecified site: Secondary | ICD-10-CM | POA: Diagnosis not present

## 2016-09-12 DIAGNOSIS — Z87891 Personal history of nicotine dependence: Secondary | ICD-10-CM | POA: Diagnosis not present

## 2016-09-12 DIAGNOSIS — L573 Poikiloderma of Civatte: Secondary | ICD-10-CM | POA: Diagnosis not present

## 2016-09-12 DIAGNOSIS — M3310 Other dermatopolymyositis, organ involvement unspecified: Secondary | ICD-10-CM | POA: Diagnosis not present

## 2016-09-12 DIAGNOSIS — D229 Melanocytic nevi, unspecified: Secondary | ICD-10-CM | POA: Diagnosis not present

## 2016-09-28 DIAGNOSIS — I10 Essential (primary) hypertension: Secondary | ICD-10-CM | POA: Diagnosis not present

## 2016-09-28 DIAGNOSIS — G894 Chronic pain syndrome: Secondary | ICD-10-CM | POA: Diagnosis not present

## 2016-09-28 DIAGNOSIS — R202 Paresthesia of skin: Secondary | ICD-10-CM | POA: Diagnosis not present

## 2016-09-28 DIAGNOSIS — F419 Anxiety disorder, unspecified: Secondary | ICD-10-CM | POA: Diagnosis not present

## 2016-09-28 DIAGNOSIS — F909 Attention-deficit hyperactivity disorder, unspecified type: Secondary | ICD-10-CM | POA: Diagnosis not present

## 2016-09-28 DIAGNOSIS — G459 Transient cerebral ischemic attack, unspecified: Secondary | ICD-10-CM | POA: Diagnosis not present

## 2016-09-28 DIAGNOSIS — Z6823 Body mass index (BMI) 23.0-23.9, adult: Secondary | ICD-10-CM | POA: Diagnosis not present

## 2016-09-28 LAB — TSH: TSH: 0.45 (ref <=5.90)

## 2016-10-13 DIAGNOSIS — I70213 Atherosclerosis of native arteries of extremities with intermittent claudication, bilateral legs: Secondary | ICD-10-CM | POA: Diagnosis not present

## 2016-10-13 DIAGNOSIS — G459 Transient cerebral ischemic attack, unspecified: Secondary | ICD-10-CM | POA: Diagnosis not present

## 2016-10-23 ENCOUNTER — Ambulatory Visit (INDEPENDENT_AMBULATORY_CARE_PROVIDER_SITE_OTHER): Payer: Medicare Other | Admitting: "Endocrinology

## 2016-10-23 ENCOUNTER — Encounter: Payer: Self-pay | Admitting: "Endocrinology

## 2016-10-23 VITALS — BP 134/81 | HR 79 | Ht 67.5 in | Wt 156.0 lb

## 2016-10-23 DIAGNOSIS — E0590 Thyrotoxicosis, unspecified without thyrotoxic crisis or storm: Secondary | ICD-10-CM | POA: Diagnosis not present

## 2016-10-23 NOTE — Progress Notes (Signed)
Subjective:    Patient ID: Kathryn Austin, female    DOB: [birth year], PCP Redmond School, MD   Past Medical History:  Diagnosis Date  . Active smoker 2014  . Anxiety   . Dermatomyositis ([place])   . GERD (gastroesophageal reflux disease)   . Hypertension   . 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 ABDOMINAL HYSTERECTOMY N/A 09/04/2012   Procedure: HYSTERECTOMY ABDOMINAL;  Surgeon: Florian Buff, MD;  Location: [facility];  Service: Gynecology;  Laterality: N/A;  . CESAREAN SECTION     x2  . CHOLECYSTECTOMY  03/31/2011   Procedure: LAPAROSCOPIC CHOLECYSTECTOMY;  Surgeon: Jamesetta So, MD;  Location: [facility];  Service: General;  Laterality: N/A;  . COLONOSCOPY WITH ESOPHAGOGASTRODUODENOSCOPY (EGD) N/A 01/29/2013   Procedure: COLONOSCOPY WITH ESOPHAGOGASTRODUODENOSCOPY (EGD);  Surgeon: Rogene Houston, MD;  [facility];  Service: Endoscopy;  Laterality: N/A;  1200-moved to 1250 Ann to notify pt  . DILATION AND CURETTAGE OF UTERUS    . SALPINGOOPHORECTOMY Bilateral 09/04/2012   Procedure: BILATERAL SALPINGO OOPHORECTOMY;  Surgeon: Florian Buff, MD;  Location: [facility];  Service: Gynecology;  Laterality: Bilateral;  . TONSILLECTOMY    . TUBAL LIGATION     Social History   Social History  . Marital status: Married    Spouse name: N/A  . Number of children: N/A  . Years of education: N/A   Social History Main Topics  . Smoking status: Current Every Day Smoker    Packs/day: 1.00    Years: 30.00    Types: Cigarettes  . Smokeless tobacco: Never Used     Comment: currently smoking .5ppd  . Alcohol use No  . Drug use: No  . Sexual activity: Not Currently    Birth control/ protection: Surgical   Other Topics Concern  . None   Social History Narrative  . None   Outpatient Encounter Prescriptions as of 10/23/2016  Medication Sig  . ALPRAZolam (XANAX) 0.5 MG tablet Take 0.5 mg by mouth 3 (three) times  daily as needed for sleep or anxiety. anxiety  . amphetamine-dextroamphetamine (ADDERALL) 20 MG tablet Take 20 mg by mouth 2 (two) times daily.   Marland Kitchen aspirin 81 MG chewable tablet Chew by mouth daily.  Marland Kitchen estradiol (ESTRACE) 2 MG tablet TAKE 1 TABLET (2 MG TOTAL) BY MOUTH DAILY.  . fluticasone (FLONASE) 50 MCG/ACT nasal spray Place into both nostrils daily.  . folic acid (FOLVITE) 1 MG tablet Take 1 mg by mouth daily.  . methotrexate (RHEUMATREX) 2.5 MG tablet Take 20 mg by mouth once a week. Caution:Chemotherapy. Protect from light. Takes on Wednesday  . mirtazapine (REMERON) 30 MG tablet Take 30 mg by mouth at bedtime.  . Multiple Vitamin (MULTIVITAMIN) tablet Take 1 tablet by mouth daily.  . valACYclovir (VALTREX) 1000 MG tablet Take 1,000 mg by mouth 3 (three) times daily.  . [DISCONTINUED] HYDROcodone-acetaminophen (NORCO/VICODIN) 5-325 MG per tablet Take 1 tablet by mouth every 6 (six) hours as needed for moderate pain.  . [DISCONTINUED] pantoprazole (PROTONIX) 40 MG tablet TAKE 1 TABLET (40 MG TOTAL) BY MOUTH DAILY.  . [DISCONTINUED] ranitidine (ZANTAC) 150 MG capsule Take 150 mg by mouth 2 (two) times daily.  . [DISCONTINUED] valACYclovir (VALTREX) 1000 MG tablet Take 1,000 mg by mouth 3 (three) times daily.   No facility-administered encounter medications on file as of 10/23/2016.    ALLERGIES: Allergies  Allergen Reactions  .  Codeine Nausea And Vomiting and Other (See Comments)    Shortness of breath    VACCINATION STATUS:  There is no immunization history on file for this patient.  HPI 41 year old female patient with medical history as above. She is being seen in consultation for subclinical hyperthyroidism requested by her PCP Dr. Gerarda Fraction. - She does not report any prior history of thyroid dysfunction. She denies any family history of thyroid cancer thyroid dysfunction. She was found to have suppressed TSH of 0.448 on 09/28/2016. - She denies weight loss, heat intolerance,  tremors. She does have long-standing problem with sleep. She denies dysphagia, shortness of breath, voice change. - She denies any history of radiation to the neck. She is not currently on any thyroid hormone/supplements. - Her history also includes dermatomyositis for which she is on methotrexate.  Review of Systems  Constitutional: + steady weight, + fatigue, no subjective hyperthermia, no subjective hypothermia Eyes: no blurry vision, no xerophthalmia ENT: no sore throat, no nodules palpated in throat, no dysphagia/odynophagia, no hoarseness Cardiovascular: no Chest Pain, no Shortness of Breath, no palpitations, no leg swelling Respiratory: no cough, no SOB Gastrointestinal: no Nausea/Vomiting/Diarhhea Musculoskeletal: no muscle/joint aches Skin: no rashes Neurological: no tremors, no numbness, no tingling, no dizziness Psychiatric: no depression, no anxiety  Objective:    BP 134/81   Pulse 79   Ht 5' 7.5" (1.715 m)   Wt 156 lb (70.8 kg)   LMP 08/30/2012   BMI 24.07 kg/m   Wt Readings from Last 3 Encounters:  10/23/16 156 lb (70.8 kg)  06/01/16 157 lb 3.2 oz (71.3 kg)  05/12/16 157 lb (71.2 kg)    Physical Exam  Constitutional: + Appropriate weight for height, not in acute distress, normal state of mind Eyes: PERRLA, EOMI, no exophthalmos ENT: moist mucous membranes, no thyromegaly, no cervical lymphadenopathy Cardiovascular: normal precordial activity, Regular Rate and Rhythm, no Murmur/Rubs/Gallops Respiratory:  adequate breathing efforts, no gross chest deformity, Clear to auscultation bilaterally Gastrointestinal: abdomen soft, Non -tender, No distension, Bowel Sounds present Musculoskeletal: no gross deformities, strength intact in all four extremities Skin: moist, warm, no rashes Neurological: no tremor with outstretched hands, Deep tendon reflexes normal in all four extremities.  CMP ( most recent) CMP     Component Value Date/Time   NA 139 05/12/2016 1400    K 3.8 05/12/2016 1400   CL 106 05/12/2016 1400   CO2 25 05/12/2016 1400   GLUCOSE 93 05/12/2016 1400   BUN 10 05/12/2016 1400   CREATININE 0.66 05/12/2016 1400   CREATININE 0.75 11/05/2012 1546   CALCIUM 9.3 05/12/2016 1400   PROT 7.3 05/12/2016 1400   ALBUMIN 4.3 05/12/2016 1400   AST 22 05/12/2016 1400   ALT 21 05/12/2016 1400   ALKPHOS 70 05/12/2016 1400   BILITOT 0.3 05/12/2016 1400   GFRNONAA >60 05/12/2016 1400   GFRAA >60 05/12/2016 1400    TSH from 09/28/2016 was 0.448.    Assessment & Plan:   1. Subclinical hyperthyroidism - DENNISHA MOUSER  is being seen at a kind request of Redmond School, MD. - I have reviewed her available Thyroid records and clinically evaluated the patient. - Based on my reviews, she has subclinical hyperthyroidism with TSH of 0.448.  -She will need a repeat,  more complete thyroid function testing for living TSH, free T4, free T3, and antithyroid antibodies. -she will return in 1 week to review her repeat labs. If her labs are suggestive, she would be considered for thyroid uptake and  scan to confirm diagnosis. - I did not initiate any new prescriptions today.  - Time spent with the patient: 45 minutes, of which >50% was spent in obtaining information about her symptoms, reviewing her previous labs, evaluations, and treatments, counseling her about her thyroid dysfunction and developing a plan for diagnosis and treatment.   - I advised patient to maintain close follow up with Redmond School, MD for primary care needs. Follow up plan: Return in about 1 week (around 10/30/2016) for labs today.  Glade Lloyd, MD [facility] Phone: [phone]  Fax: [phone]   10/23/2016, 5:17 PM This note was partially dictated with voice recognition software. Similar sounding words can be transcribed inadequately or may not  be corrected upon review.

## 2016-10-24 LAB — THYROGLOBULIN ANTIBODY: Thyroglobulin Ab: <1 IU/mL (ref <2)

## 2016-10-24 LAB — TSH: TSH: 0.67 mIU/L

## 2016-10-24 LAB — THYROID PEROXIDASE ANTIBODY: Thyroperoxidase Ab SerPl-aCnc: 1 IU/mL (ref <9)

## 2016-10-24 LAB — T3, FREE: T3, Free: 2.6 pg/mL (ref 2.3–4.2)

## 2016-10-24 LAB — T4, FREE: FREE T4: 1.1 ng/dL (ref 0.8–1.8)

## 2016-10-31 ENCOUNTER — Ambulatory Visit: Payer: Medicare Other | Admitting: "Endocrinology

## 2016-11-24 ENCOUNTER — Ambulatory Visit: Payer: Medicare Other | Admitting: "Endocrinology

## 2016-12-04 DIAGNOSIS — Z23 Encounter for immunization: Secondary | ICD-10-CM | POA: Diagnosis not present

## 2016-12-04 DIAGNOSIS — I10 Essential (primary) hypertension: Secondary | ICD-10-CM | POA: Diagnosis not present

## 2016-12-04 DIAGNOSIS — Z6825 Body mass index (BMI) 25.0-25.9, adult: Secondary | ICD-10-CM | POA: Diagnosis not present

## 2016-12-04 DIAGNOSIS — R002 Palpitations: Secondary | ICD-10-CM | POA: Diagnosis not present

## 2016-12-04 DIAGNOSIS — E063 Autoimmune thyroiditis: Secondary | ICD-10-CM | POA: Diagnosis not present

## 2016-12-04 DIAGNOSIS — M3313 Other dermatomyositis without myopathy: Secondary | ICD-10-CM | POA: Diagnosis not present

## 2016-12-04 DIAGNOSIS — F419 Anxiety disorder, unspecified: Secondary | ICD-10-CM | POA: Diagnosis not present

## 2016-12-04 DIAGNOSIS — Z0000 Encounter for general adult medical examination without abnormal findings: Secondary | ICD-10-CM | POA: Diagnosis not present

## 2016-12-04 DIAGNOSIS — F909 Attention-deficit hyperactivity disorder, unspecified type: Secondary | ICD-10-CM | POA: Diagnosis not present

## 2017-03-14 DIAGNOSIS — G894 Chronic pain syndrome: Secondary | ICD-10-CM | POA: Diagnosis not present

## 2017-03-14 DIAGNOSIS — E663 Overweight: Secondary | ICD-10-CM | POA: Diagnosis not present

## 2017-03-14 DIAGNOSIS — Z1389 Encounter for screening for other disorder: Secondary | ICD-10-CM | POA: Diagnosis not present

## 2017-03-14 DIAGNOSIS — Z6825 Body mass index (BMI) 25.0-25.9, adult: Secondary | ICD-10-CM | POA: Diagnosis not present

## 2017-03-14 DIAGNOSIS — J329 Chronic sinusitis, unspecified: Secondary | ICD-10-CM | POA: Diagnosis not present

## 2017-03-14 DIAGNOSIS — I10 Essential (primary) hypertension: Secondary | ICD-10-CM | POA: Diagnosis not present

## 2017-05-07 DIAGNOSIS — R350 Frequency of micturition: Secondary | ICD-10-CM | POA: Diagnosis not present

## 2017-05-07 DIAGNOSIS — J029 Acute pharyngitis, unspecified: Secondary | ICD-10-CM | POA: Diagnosis not present

## 2017-05-07 DIAGNOSIS — Z6825 Body mass index (BMI) 25.0-25.9, adult: Secondary | ICD-10-CM | POA: Diagnosis not present

## 2017-05-07 DIAGNOSIS — H65192 Other acute nonsuppurative otitis media, left ear: Secondary | ICD-10-CM | POA: Diagnosis not present

## 2017-05-07 DIAGNOSIS — R1033 Periumbilical pain: Secondary | ICD-10-CM | POA: Diagnosis not present

## 2017-05-07 DIAGNOSIS — M549 Dorsalgia, unspecified: Secondary | ICD-10-CM | POA: Diagnosis not present

## 2017-05-18 MED FILL — OXYCODONE-ACETAMINOPHEN 5-3: 5-325 | 30 days supply | Qty: 120 | Fill #0

## 2017-06-11 DIAGNOSIS — F909 Attention-deficit hyperactivity disorder, unspecified type: Secondary | ICD-10-CM | POA: Diagnosis not present

## 2017-06-11 DIAGNOSIS — R319 Hematuria, unspecified: Secondary | ICD-10-CM | POA: Diagnosis not present

## 2017-06-11 DIAGNOSIS — M5416 Radiculopathy, lumbar region: Secondary | ICD-10-CM | POA: Diagnosis not present

## 2017-06-11 DIAGNOSIS — F329 Major depressive disorder, single episode, unspecified: Secondary | ICD-10-CM | POA: Diagnosis not present

## 2017-06-11 DIAGNOSIS — R29818 Other symptoms and signs involving the nervous system: Secondary | ICD-10-CM | POA: Diagnosis not present

## 2017-06-11 DIAGNOSIS — Z1389 Encounter for screening for other disorder: Secondary | ICD-10-CM | POA: Diagnosis not present

## 2017-06-11 DIAGNOSIS — Z6826 Body mass index (BMI) 26.0-26.9, adult: Secondary | ICD-10-CM | POA: Diagnosis not present

## 2017-06-11 DIAGNOSIS — E663 Overweight: Secondary | ICD-10-CM | POA: Diagnosis not present

## 2017-06-11 DIAGNOSIS — G894 Chronic pain syndrome: Secondary | ICD-10-CM | POA: Diagnosis not present

## 2017-06-11 DIAGNOSIS — M3390 Dermatopolymyositis, unspecified, organ involvement unspecified: Secondary | ICD-10-CM | POA: Diagnosis not present

## 2017-06-11 DIAGNOSIS — F419 Anxiety disorder, unspecified: Secondary | ICD-10-CM | POA: Diagnosis not present

## 2017-07-30 ENCOUNTER — Ambulatory Visit (INDEPENDENT_AMBULATORY_CARE_PROVIDER_SITE_OTHER): Payer: Self-pay | Admitting: Internal Medicine

## 2017-07-30 ENCOUNTER — Encounter (INDEPENDENT_AMBULATORY_CARE_PROVIDER_SITE_OTHER): Payer: Self-pay | Admitting: Internal Medicine

## 2017-08-13 ENCOUNTER — Other Ambulatory Visit (HOSPITAL_COMMUNITY): Payer: Self-pay | Admitting: Internal Medicine

## 2017-08-13 ENCOUNTER — Ambulatory Visit (HOSPITAL_COMMUNITY)
Admission: RE | Admit: 2017-08-13 | Discharge: 2017-08-13 | Disposition: A | Discharge status: Home / Self Care | Payer: Medicare Other | Source: Ambulatory Visit | Attending: Internal Medicine | Admitting: Internal Medicine

## 2017-08-13 DIAGNOSIS — R0789 Other chest pain: Secondary | ICD-10-CM | POA: Diagnosis not present

## 2017-08-13 DIAGNOSIS — R079 Chest pain, unspecified: Secondary | ICD-10-CM

## 2017-08-13 DIAGNOSIS — G894 Chronic pain syndrome: Secondary | ICD-10-CM | POA: Diagnosis not present

## 2017-08-13 DIAGNOSIS — Z6825 Body mass index (BMI) 25.0-25.9, adult: Secondary | ICD-10-CM | POA: Diagnosis not present

## 2017-08-13 DIAGNOSIS — R109 Unspecified abdominal pain: Secondary | ICD-10-CM | POA: Diagnosis not present

## 2017-08-17 DIAGNOSIS — I700 Atherosclerosis of aorta: Secondary | ICD-10-CM | POA: Diagnosis not present

## 2017-08-17 DIAGNOSIS — R109 Unspecified abdominal pain: Secondary | ICD-10-CM | POA: Diagnosis not present

## 2017-08-23 DIAGNOSIS — D485 Neoplasm of uncertain behavior of skin: Secondary | ICD-10-CM | POA: Diagnosis not present

## 2017-08-23 DIAGNOSIS — Z1283 Encounter for screening for malignant neoplasm of skin: Secondary | ICD-10-CM | POA: Diagnosis not present

## 2017-08-23 DIAGNOSIS — D225 Melanocytic nevi of trunk: Secondary | ICD-10-CM | POA: Diagnosis not present

## 2017-09-12 DIAGNOSIS — M3392 Dermatopolymyositis, unspecified with myopathy: Secondary | ICD-10-CM | POA: Diagnosis not present

## 2017-09-12 DIAGNOSIS — M2550 Pain in unspecified joint: Secondary | ICD-10-CM | POA: Diagnosis not present

## 2017-09-12 DIAGNOSIS — Z79899 Other long term (current) drug therapy: Secondary | ICD-10-CM | POA: Diagnosis not present

## 2017-09-12 DIAGNOSIS — M3312 Other dermatopolymyositis with myopathy: Secondary | ICD-10-CM | POA: Diagnosis not present

## 2017-09-12 DIAGNOSIS — Z6825 Body mass index (BMI) 25.0-25.9, adult: Secondary | ICD-10-CM | POA: Diagnosis not present

## 2017-09-12 DIAGNOSIS — R5382 Chronic fatigue, unspecified: Secondary | ICD-10-CM | POA: Diagnosis not present

## 2017-10-10 DIAGNOSIS — E785 Hyperlipidemia, unspecified: Secondary | ICD-10-CM | POA: Diagnosis not present

## 2017-10-10 DIAGNOSIS — E063 Autoimmune thyroiditis: Secondary | ICD-10-CM | POA: Diagnosis not present

## 2017-10-10 DIAGNOSIS — Z6825 Body mass index (BMI) 25.0-25.9, adult: Secondary | ICD-10-CM | POA: Diagnosis not present

## 2017-10-10 DIAGNOSIS — G894 Chronic pain syndrome: Secondary | ICD-10-CM | POA: Diagnosis not present

## 2017-10-10 DIAGNOSIS — Z1389 Encounter for screening for other disorder: Secondary | ICD-10-CM | POA: Diagnosis not present

## 2017-10-10 DIAGNOSIS — M3390 Dermatopolymyositis, unspecified, organ involvement unspecified: Secondary | ICD-10-CM | POA: Diagnosis not present

## 2017-10-10 DIAGNOSIS — E663 Overweight: Secondary | ICD-10-CM | POA: Diagnosis not present

## 2017-10-10 DIAGNOSIS — J329 Chronic sinusitis, unspecified: Secondary | ICD-10-CM | POA: Diagnosis not present

## 2017-10-10 DIAGNOSIS — R946 Abnormal results of thyroid function studies: Secondary | ICD-10-CM | POA: Diagnosis not present

## 2017-10-17 DIAGNOSIS — M4726 Other spondylosis with radiculopathy, lumbar region: Secondary | ICD-10-CM | POA: Diagnosis not present

## 2017-10-17 DIAGNOSIS — M549 Dorsalgia, unspecified: Secondary | ICD-10-CM | POA: Diagnosis not present

## 2017-11-01 DIAGNOSIS — M4726 Other spondylosis with radiculopathy, lumbar region: Secondary | ICD-10-CM | POA: Diagnosis not present

## 2017-11-01 DIAGNOSIS — M545 Low back pain: Secondary | ICD-10-CM | POA: Diagnosis not present

## 2017-11-12 DIAGNOSIS — R21 Rash and other nonspecific skin eruption: Secondary | ICD-10-CM | POA: Diagnosis not present

## 2017-11-12 DIAGNOSIS — F17290 Nicotine dependence, other tobacco product, uncomplicated: Secondary | ICD-10-CM | POA: Diagnosis not present

## 2017-11-12 DIAGNOSIS — R1084 Generalized abdominal pain: Secondary | ICD-10-CM | POA: Diagnosis not present

## 2017-11-12 DIAGNOSIS — R109 Unspecified abdominal pain: Secondary | ICD-10-CM | POA: Diagnosis not present

## 2017-11-12 DIAGNOSIS — K529 Noninfective gastroenteritis and colitis, unspecified: Secondary | ICD-10-CM | POA: Diagnosis not present

## 2017-11-12 DIAGNOSIS — R358 Other polyuria: Secondary | ICD-10-CM | POA: Diagnosis not present

## 2017-11-13 DIAGNOSIS — R109 Unspecified abdominal pain: Secondary | ICD-10-CM | POA: Diagnosis not present

## 2017-11-14 DIAGNOSIS — R200 Anesthesia of skin: Secondary | ICD-10-CM | POA: Diagnosis not present

## 2017-11-19 DIAGNOSIS — R319 Hematuria, unspecified: Secondary | ICD-10-CM | POA: Diagnosis not present

## 2017-11-19 DIAGNOSIS — G894 Chronic pain syndrome: Secondary | ICD-10-CM | POA: Diagnosis not present

## 2017-11-19 DIAGNOSIS — R221 Localized swelling, mass and lump, neck: Secondary | ICD-10-CM | POA: Diagnosis not present

## 2017-11-19 DIAGNOSIS — F419 Anxiety disorder, unspecified: Secondary | ICD-10-CM | POA: Diagnosis not present

## 2017-11-19 DIAGNOSIS — Z6823 Body mass index (BMI) 23.0-23.9, adult: Secondary | ICD-10-CM | POA: Diagnosis not present

## 2017-11-19 DIAGNOSIS — F900 Attention-deficit hyperactivity disorder, predominantly inattentive type: Secondary | ICD-10-CM | POA: Diagnosis not present

## 2017-11-22 ENCOUNTER — Ambulatory Visit (INDEPENDENT_AMBULATORY_CARE_PROVIDER_SITE_OTHER): Payer: Medicare Other | Admitting: Internal Medicine

## 2017-11-23 ENCOUNTER — Encounter: Payer: Medicare Other | Admitting: Obstetrics & Gynecology

## 2017-11-24 IMAGING — DX DG CHEST 2V
2 series · 2 of 2 positions shown · non-contrast
Comparison: 08/26/2013

CLINICAL DATA: Cough for 1 month

EXAM:
CHEST  2 VIEW

[chest pa]
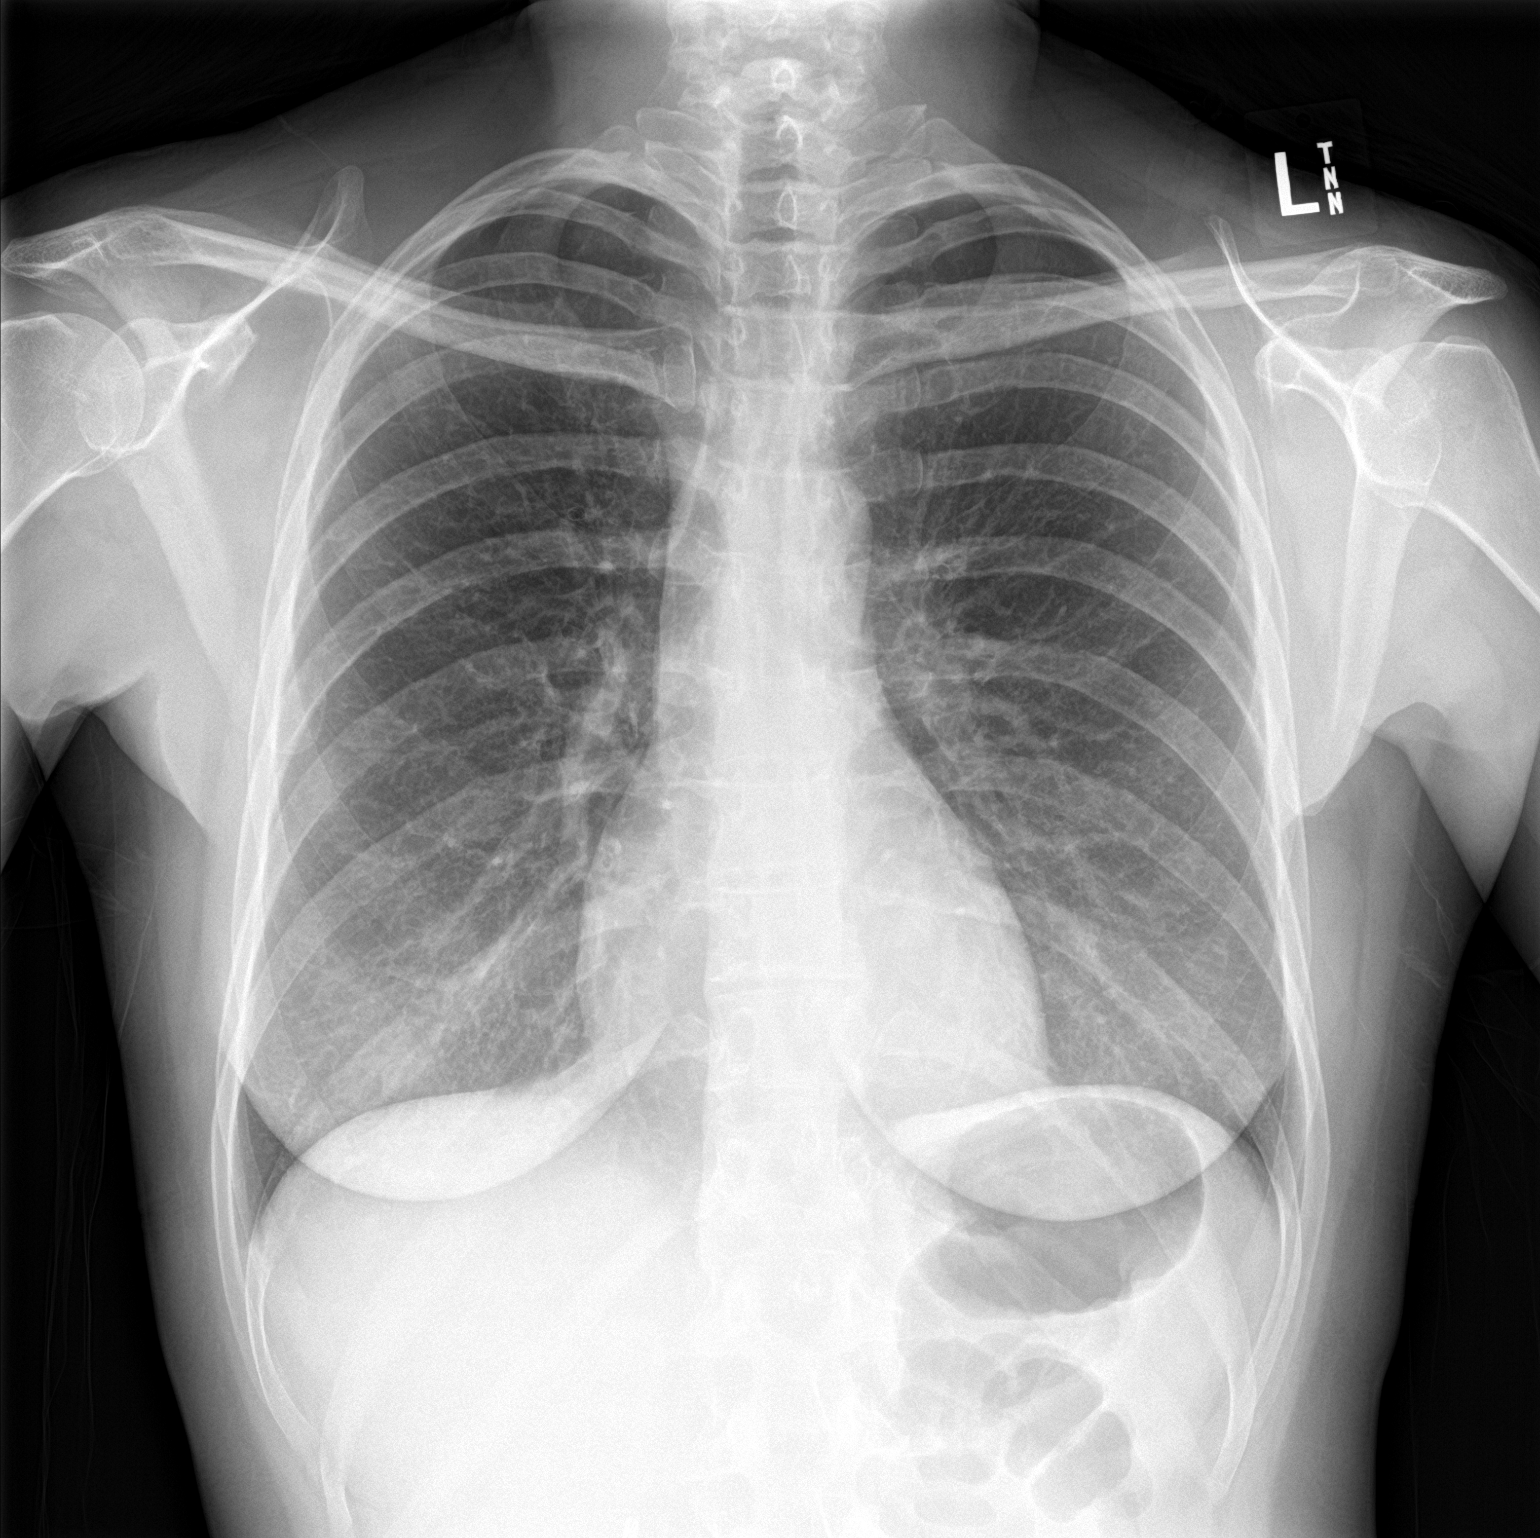

[chest lat]
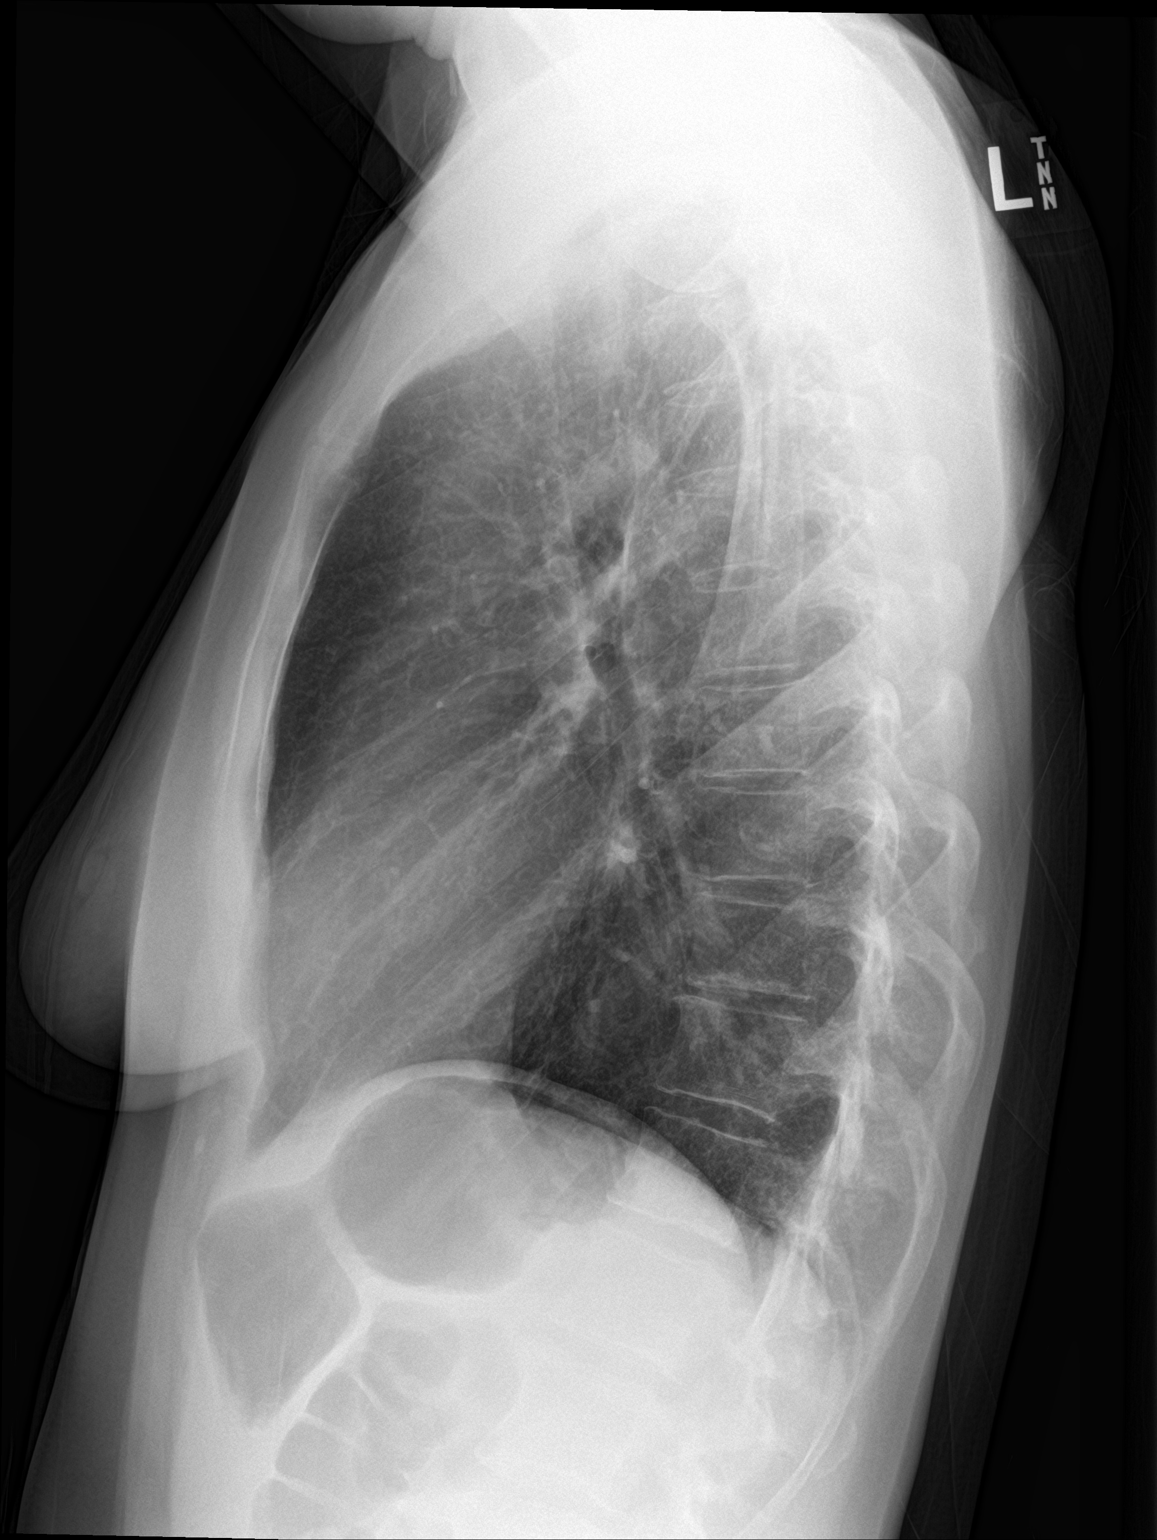

[2 of 2 positions shown; findings below may reference images not displayed]

FINDINGS: Normal heart size. Lungs clear. No pneumothorax. No pleural
effusion.
IMPRESSION: No active cardiopulmonary disease.

## 2017-11-29 ENCOUNTER — Encounter: Payer: Medicare Other | Admitting: Obstetrics & Gynecology

## 2017-12-10 ENCOUNTER — Encounter: Payer: Medicare Other | Admitting: Obstetrics and Gynecology

## 2017-12-18 DIAGNOSIS — M3392 Dermatopolymyositis, unspecified with myopathy: Secondary | ICD-10-CM | POA: Diagnosis not present

## 2017-12-18 DIAGNOSIS — R5382 Chronic fatigue, unspecified: Secondary | ICD-10-CM | POA: Diagnosis not present

## 2017-12-18 DIAGNOSIS — M3312 Other dermatopolymyositis with myopathy: Secondary | ICD-10-CM | POA: Diagnosis not present

## 2017-12-18 DIAGNOSIS — Z79899 Other long term (current) drug therapy: Secondary | ICD-10-CM | POA: Diagnosis not present

## 2017-12-18 DIAGNOSIS — Z6823 Body mass index (BMI) 23.0-23.9, adult: Secondary | ICD-10-CM | POA: Diagnosis not present

## 2017-12-18 DIAGNOSIS — M2550 Pain in unspecified joint: Secondary | ICD-10-CM | POA: Diagnosis not present

## 2018-02-12 DIAGNOSIS — Z6822 Body mass index (BMI) 22.0-22.9, adult: Secondary | ICD-10-CM | POA: Diagnosis not present

## 2018-02-12 DIAGNOSIS — E782 Mixed hyperlipidemia: Secondary | ICD-10-CM | POA: Diagnosis not present

## 2018-02-12 DIAGNOSIS — K219 Gastro-esophageal reflux disease without esophagitis: Secondary | ICD-10-CM | POA: Diagnosis not present

## 2018-02-12 DIAGNOSIS — F329 Major depressive disorder, single episode, unspecified: Secondary | ICD-10-CM | POA: Diagnosis not present

## 2018-02-12 DIAGNOSIS — E063 Autoimmune thyroiditis: Secondary | ICD-10-CM | POA: Diagnosis not present

## 2018-02-12 DIAGNOSIS — R311 Benign essential microscopic hematuria: Secondary | ICD-10-CM | POA: Diagnosis not present

## 2018-02-12 DIAGNOSIS — Z0001 Encounter for general adult medical examination with abnormal findings: Secondary | ICD-10-CM | POA: Diagnosis not present

## 2018-02-12 DIAGNOSIS — I10 Essential (primary) hypertension: Secondary | ICD-10-CM | POA: Diagnosis not present

## 2018-02-12 DIAGNOSIS — Z1389 Encounter for screening for other disorder: Secondary | ICD-10-CM | POA: Diagnosis not present

## 2018-02-12 DIAGNOSIS — J329 Chronic sinusitis, unspecified: Secondary | ICD-10-CM | POA: Diagnosis not present

## 2018-02-12 DIAGNOSIS — N959 Unspecified menopausal and perimenopausal disorder: Secondary | ICD-10-CM | POA: Diagnosis not present

## 2018-02-12 DIAGNOSIS — F909 Attention-deficit hyperactivity disorder, unspecified type: Secondary | ICD-10-CM | POA: Diagnosis not present

## 2018-03-05 DIAGNOSIS — J22 Unspecified acute lower respiratory infection: Secondary | ICD-10-CM | POA: Diagnosis not present

## 2018-03-05 DIAGNOSIS — J209 Acute bronchitis, unspecified: Secondary | ICD-10-CM | POA: Diagnosis not present

## 2018-03-05 DIAGNOSIS — Z6821 Body mass index (BMI) 21.0-21.9, adult: Secondary | ICD-10-CM | POA: Diagnosis not present

## 2018-04-04 DIAGNOSIS — Z23 Encounter for immunization: Secondary | ICD-10-CM | POA: Diagnosis not present

## 2018-04-04 DIAGNOSIS — J301 Allergic rhinitis due to pollen: Secondary | ICD-10-CM | POA: Diagnosis not present

## 2018-04-04 DIAGNOSIS — Z6822 Body mass index (BMI) 22.0-22.9, adult: Secondary | ICD-10-CM | POA: Diagnosis not present

## 2018-04-04 DIAGNOSIS — G894 Chronic pain syndrome: Secondary | ICD-10-CM | POA: Diagnosis not present

## 2018-04-04 DIAGNOSIS — F900 Attention-deficit hyperactivity disorder, predominantly inattentive type: Secondary | ICD-10-CM | POA: Diagnosis not present

## 2018-04-15 ENCOUNTER — Ambulatory Visit (INDEPENDENT_AMBULATORY_CARE_PROVIDER_SITE_OTHER): Payer: Medicare Other | Admitting: "Endocrinology

## 2018-04-15 ENCOUNTER — Encounter: Payer: Self-pay | Admitting: "Endocrinology

## 2018-04-15 VITALS — BP 130/87 | HR 97 | Ht 67.5 in | Wt 149.0 lb

## 2018-04-15 DIAGNOSIS — E0590 Thyrotoxicosis, unspecified without thyrotoxic crisis or storm: Secondary | ICD-10-CM

## 2018-04-15 NOTE — Progress Notes (Signed)
Endocrinology follow-up note   Subjective:    Patient ID: Kathryn Austin, female    DOB: 1975/06/22, PCP Redmond School, MD   Past Medical History:  Diagnosis Date  . Active smoker 2014  . Anxiety   . Dermatomyositis ([place])   . GERD (gastroesophageal reflux disease)   . Hypertension   . 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 ABDOMINAL HYSTERECTOMY N/A 09/04/2012   Procedure: HYSTERECTOMY ABDOMINAL;  Surgeon: Florian Buff, MD;  Location: [facility];  Service: Gynecology;  Laterality: N/A;  . CESAREAN SECTION     x2  . CHOLECYSTECTOMY  03/31/2011   Procedure: LAPAROSCOPIC CHOLECYSTECTOMY;  Surgeon: Jamesetta So, MD;  Location: [facility];  Service: General;  Laterality: N/A;  . COLONOSCOPY WITH ESOPHAGOGASTRODUODENOSCOPY (EGD) N/A 01/29/2013   Procedure: COLONOSCOPY WITH ESOPHAGOGASTRODUODENOSCOPY (EGD);  Surgeon: Rogene Houston, MD;  Location: [facility];  Service: Endoscopy;  Laterality: N/A;  1200-moved to 1250 Ann to notify pt  . DILATION AND CURETTAGE OF UTERUS    . SALPINGOOPHORECTOMY Bilateral 09/04/2012   Procedure: BILATERAL SALPINGO OOPHORECTOMY;  Surgeon: Florian Buff, MD;  Location: [facility];  Service: Gynecology;  Laterality: Bilateral;  . TONSILLECTOMY    . TUBAL LIGATION     Social History   Socioeconomic History  . Marital status: Married    Spouse name: Not on file  . Number of children: Not on file  . Years of education: Not on file  . Highest education level: Not on file  Occupational History  . Not on file  Social Needs  . Financial resource strain: Not on file  . Food insecurity:    Worry: Not on file    Inability: Not on file  . Transportation needs:    Medical: Not on file    Non-medical: Not on file  Tobacco Use  . Smoking status: Current Every Day Smoker    Packs/day: 1.00    Years: 30.00    Pack years: 30.00    Types: Cigarettes  . Smokeless tobacco: Never Used  . Tobacco  comment: currently smoking .5ppd  Substance and Sexual Activity  . Alcohol use: No  . Drug use: No  . Sexual activity: Not Currently    Birth control/protection: Surgical  Lifestyle  . Physical activity:    Days per week: Not on file    Minutes per session: Not on file  . Stress: Not on file  Relationships  . Social connections:    Talks on phone: Not on file    Gets together: Not on file    Attends religious service: Not on file    Active member of club or organization: Not on file    Attends meetings of clubs or organizations: Not on file    Relationship status: Not on file  Other Topics Concern  . Not on file  Social History Narrative  . Not on file   Outpatient Encounter Medications as of 04/15/2018  Medication Sig  . ALPRAZolam (XANAX) 0.5 MG tablet Take 0.5 mg by mouth 3 (three) times daily as needed for sleep or anxiety. anxiety  . amphetamine-dextroamphetamine (ADDERALL) 20 MG tablet Take 20 mg by mouth 2 (two) times daily.   Marland Kitchen aspirin 81 MG chewable tablet Chew by mouth daily.  . hydroxychloroquine (PLAQUENIL) 200 MG tablet Take by mouth 2 (two) times daily.  . methotrexate (RHEUMATREX) 2.5 MG tablet Take 20 mg by mouth once  a week. Caution:Chemotherapy. Protect from light. Takes on Wednesday  . mirtazapine (REMERON) 30 MG tablet Take 30 mg by mouth at bedtime.  . nebivolol (BYSTOLIC) 5 MG tablet Take 5 mg by mouth daily.  Marland Kitchen oxyCODONE-acetaminophen (PERCOCET/ROXICET) 5-325 MG tablet Take by mouth every 8 (eight) hours as needed for severe pain.  . [DISCONTINUED] estradiol (ESTRACE) 2 MG tablet TAKE 1 TABLET (2 MG TOTAL) BY MOUTH DAILY.  . [DISCONTINUED] fluticasone (FLONASE) 50 MCG/ACT nasal spray Place into both nostrils daily.  . [DISCONTINUED] folic acid (FOLVITE) 1 MG tablet Take 1 mg by mouth daily.  . [DISCONTINUED] Multiple Vitamin (MULTIVITAMIN) tablet Take 1 tablet by mouth daily.  . [DISCONTINUED] valACYclovir (VALTREX) 1000 MG tablet Take 1,000 mg by mouth 3  (three) times daily.   No facility-administered encounter medications on file as of 04/15/2018.    ALLERGIES: Allergies  Allergen Reactions  . Codeine Nausea And Vomiting and Other (See Comments)    Shortness of breath    VACCINATION STATUS:  There is no immunization history on file for this patient.  HPI 43 year old female patient with medical history as above. She was seen previously in consultation for subclinical hyperthyroidism.   She did not require any antithyroid intervention at that time.   -She returns with some weight loss, no recent thyroid function test to review.  She thinks that her thyroid has increased in size in the interval. -She denies any family history of thyroid cancer thyroid dysfunction.  - She denies heat intolerance, tremors. She does have long-standing problem with sleep. She denies dysphagia, shortness of breath, voice change. - She denies any history of radiation to the neck. She is not currently on any thyroid hormone/supplements. - Her history also includes dermatomyositis for which she is on methotrexate.  Review of Systems  Constitutional: +  Weigh losst, + fatigue, no subjective hyperthermia, no subjective hypothermia Eyes: no blurry vision, no xerophthalmia ENT: no sore throat, no nodules palpated in throat, no dysphagia/odynophagia, no hoarseness Cardiovascular: no Chest Pain, no Shortness of Breath, no palpitations,no leg swelling Respiratory: no cough, no SOB Gastrointestinal: no Nausea/Vomiting/Diarhhea Musculoskeletal: no muscle/joint aches Skin: no rashes Neurological: no tremors, no numbness, no tingling, no dizziness Psychiatric: + depression, no anxiety  Objective:    BP 130/87   Pulse 97   Ht 5' 7.5" (1.715 m)   Wt 149 lb (67.6 kg)   LMP 08/30/2012   BMI 22.99 kg/m   Wt Readings from Last 3 Encounters:  04/15/18 149 lb (67.6 kg)  10/23/16 156 lb (70.8 kg)  06/01/16 157 lb 3.2 oz (71.3 kg)    Physical  Exam  Constitutional: + Appropriate weight for height, not in acute distress.   Eyes: PERRLA, EOMI, no exophthalmos ENT: moist mucous membranes, no thyromegaly, no cervical lymphadenopathy Cardiovascular: normal precordial activity, regular rate and rhythm, no Murmur/Rubs/Gallops  Musculoskeletal: no gross deformities, strength intact in all four extremities Skin: moist, warm, no rashes Neurological:  + tremor with outstretched hands, Deep tendon reflexes normal in all four extremities.  CMP ( most recent) CMP     Component Value Date/Time   NA 139 05/12/2016 1400   K 3.8 05/12/2016 1400   CL 106 05/12/2016 1400   CO2 25 05/12/2016 1400   GLUCOSE 93 05/12/2016 1400   BUN 10 05/12/2016 1400   CREATININE 0.66 05/12/2016 1400   CREATININE 0.75 11/05/2012 1546   CALCIUM 9.3 05/12/2016 1400   PROT 7.3 05/12/2016 1400   ALBUMIN 4.3 05/12/2016 1400   AST  22 05/12/2016 1400   ALT 21 05/12/2016 1400   ALKPHOS 70 05/12/2016 1400   BILITOT 0.3 05/12/2016 1400   GFRNONAA >60 05/12/2016 1400   GFRAA >60 05/12/2016 1400    TSH from 09/28/2016 was 0.448.    Assessment & Plan:   1. Subclinical hyperthyroidism -She will be sent for new set of thyroid function tests. -Given her new neck complaints, she is offered surveillance thyroid ultrasound. -She has history of subclinical hyperthyroidism which did not require antithyroid intervention.  She will return in 2 weeks to review her studies and treatment decisions if necessary. -I did not initiate any new prescriptions for her today.  - I advised patient to maintain close follow up with Redmond School, MD for primary care needs. Follow up plan: Return in about 2 weeks (around 04/29/2018) for Labs Today- Non-Fasting Ok, Thyroid / Neck Ultrasound.  Glade Lloyd, MD [facility] [place] [facility] Phone: [phone]  Fax: [phone]   04/15/2018, 4:22 PM This note was partially dictated with voice  recognition software. Similar sounding words can be transcribed inadequately or may not  be corrected upon review.

## 2018-04-16 LAB — T4, FREE: Free T4: 1.1 ng/dL (ref 0.8–1.8)

## 2018-04-16 LAB — TSH: TSH: 0.55 mIU/L

## 2018-04-16 LAB — T3, FREE: T3, Free: 2.5 pg/mL (ref 2.3–4.2)

## 2018-04-23 DIAGNOSIS — E0590 Thyrotoxicosis, unspecified without thyrotoxic crisis or storm: Secondary | ICD-10-CM | POA: Diagnosis not present

## 2018-04-30 ENCOUNTER — Encounter: Payer: Self-pay | Admitting: "Endocrinology

## 2018-04-30 ENCOUNTER — Ambulatory Visit (INDEPENDENT_AMBULATORY_CARE_PROVIDER_SITE_OTHER): Payer: Medicare Other | Admitting: "Endocrinology

## 2018-04-30 VITALS — BP 128/81 | HR 86 | Resp 12 | Ht 69.0 in | Wt 148.4 lb

## 2018-04-30 DIAGNOSIS — E0590 Thyrotoxicosis, unspecified without thyrotoxic crisis or storm: Secondary | ICD-10-CM | POA: Diagnosis not present

## 2018-04-30 DIAGNOSIS — E049 Nontoxic goiter, unspecified: Secondary | ICD-10-CM | POA: Diagnosis not present

## 2018-04-30 NOTE — Progress Notes (Signed)
Endocrinology follow-up note   Subjective:    Patient ID: Kathryn Austin, female    DOB: 1975/10/31, PCP Redmond School, MD   Past Medical History:  Diagnosis Date  . Active smoker 2014  . Anxiety   . Dermatomyositis ([place])   . GERD (gastroesophageal reflux disease)   . Hypertension   . 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 ABDOMINAL HYSTERECTOMY N/A 09/04/2012   Procedure: HYSTERECTOMY ABDOMINAL;  Surgeon: Florian Buff, MD;  Location: [facility];  Service: Gynecology;  Laterality: N/A;  . CESAREAN SECTION     x2  . CHOLECYSTECTOMY  03/31/2011   Procedure: LAPAROSCOPIC CHOLECYSTECTOMY;  Surgeon: Jamesetta So, MD;  Location: [facility];  Service: General;  Laterality: N/A;  . COLONOSCOPY WITH ESOPHAGOGASTRODUODENOSCOPY (EGD) N/A 01/29/2013   Procedure: COLONOSCOPY WITH ESOPHAGOGASTRODUODENOSCOPY (EGD);  Surgeon: Rogene Houston, MD;  Location[facility];  Service: Endoscopy;  Laterality: N/A;  1200-moved to 1250 Ann to notify pt  . DILATION AND CURETTAGE OF UTERUS    . SALPINGOOPHORECTOMY Bilateral 09/04/2012   Procedure: BILATERAL SALPINGO OOPHORECTOMY;  Surgeon: Florian Buff, MD;  Location: [facility];  Service: Gynecology;  Laterality: Bilateral;  . TONSILLECTOMY    . TUBAL LIGATION     Social History   Socioeconomic History  . Marital status: Married    Spouse name: Not on file  . Number of children: Not on file  . Years of education: Not on file  . Highest education level: Not on file  Occupational History  . Not on file  Social Needs  . Financial resource strain: Not on file  . Food insecurity:    Worry: Not on file    Inability: Not on file  . Transportation needs:    Medical: Not on file    Non-medical: Not on file  Tobacco Use  . Smoking status: Former Smoker    Packs/day: 1.00    Years: 30.00    Pack years: 30.00    Types: Cigarettes  . Smokeless tobacco: Never Used  . Tobacco comment:  currently smoking .5ppd  Substance and Sexual Activity  . Alcohol use: No  . Drug use: No  . Sexual activity: Not Currently    Birth control/protection: Surgical  Lifestyle  . Physical activity:    Days per week: Not on file    Minutes per session: Not on file  . Stress: Not on file  Relationships  . Social connections:    Talks on phone: Not on file    Gets together: Not on file    Attends religious service: Not on file    Active member of club or organization: Not on file    Attends meetings of clubs or organizations: Not on file    Relationship status: Not on file  Other Topics Concern  . Not on file  Social History Narrative  . Not on file   Outpatient Encounter Medications as of 04/30/2018  Medication Sig  . ALPRAZolam (XANAX) 0.5 MG tablet Take 0.5 mg by mouth 3 (three) times daily as needed for sleep or anxiety. anxiety  . amphetamine-dextroamphetamine (ADDERALL) 20 MG tablet Take 20 mg by mouth 2 (two) times daily.   Marland Kitchen aspirin 81 MG chewable tablet Chew by mouth daily.  . hydroxychloroquine (PLAQUENIL) 200 MG tablet Take by mouth 2 (two) times daily.  . methotrexate (RHEUMATREX) 2.5 MG tablet Take 20 mg by mouth once a week.  Caution:Chemotherapy. Protect from light. Takes on Wednesday  . mirtazapine (REMERON) 30 MG tablet Take 30 mg by mouth at bedtime.  . nebivolol (BYSTOLIC) 5 MG tablet Take 5 mg by mouth daily.  Marland Kitchen oxyCODONE-acetaminophen (PERCOCET/ROXICET) 5-325 MG tablet Take by mouth every 8 (eight) hours as needed for severe pain.   No facility-administered encounter medications on file as of 04/30/2018.    ALLERGIES: Allergies  Allergen Reactions  . Codeine Nausea And Vomiting and Other (See Comments)    Shortness of breath    VACCINATION STATUS:  There is no immunization history on file for this patient.  HPI 43 year old female patient with medical history as above.  She is returning with repeat thyroid function test and thyroid ultrasound.  She is not on  any thyroid hormone nor antithyroid treatment at this time.   -She has steady weight, denies palpitations, tremors, heat/cold intolerance.   -She denies any family history of thyroid cancer thyroid dysfunction.  - She denies dysphagia, shortness of breath, voice change. - She denies any history of radiation to the neck.  - Her history also includes dermatomyositis for which she is on methotrexate.  Review of Systems  Constitutional: + steady weight, + fatigue, no subjective hyperthermia, no subjective hypothermia Eyes: no blurry vision, no xerophthalmia ENT: no sore throat, no nodules palpated in throat, + on and off dysphagia, no hoarseness Cardiovascular: no Chest Pain, no Shortness of Breath, no palpitations,no leg swelling  Musculoskeletal: no muscle/joint aches Skin: no rashes Neurological: no tremors, no numbness, no tingling, no dizziness Psychiatric: + depression, no anxiety  Objective:    BP 128/81   Pulse 86   Resp 12   Ht 5\' 9"  (1.753 m)   Wt 148 lb 6.4 oz (67.3 kg)   LMP 08/30/2012   SpO2 99%   BMI 21.91 kg/m   Wt Readings from Last 3 Encounters:  04/30/18 148 lb 6.4 oz (67.3 kg)  04/15/18 149 lb (67.6 kg)  10/23/16 156 lb (70.8 kg)    Physical Exam  Constitutional: + Appropriate weight for height, not in acute distress.   Eyes: PERRLA, EOMI, no exophthalmos ENT: moist mucous membranes, + palpable mild thyromegaly, no cervical lymphadenopathy Cardiovascular: normal precordial activity, regular rate and rhythm, no Murmur/Rubs/Gallops  Musculoskeletal: no gross deformities, strength intact in all four extremities Skin: moist, warm, no rashes Neurological:  - tremor with outstretched hands, Deep tendon reflexes normal in all four extremities.  CMP ( most recent) CMP     Component Value Date/Time   NA 139 05/12/2016 1400   K 3.8 05/12/2016 1400   CL 106 05/12/2016 1400   CO2 25 05/12/2016 1400   GLUCOSE 93 05/12/2016 1400   BUN 10 05/12/2016 1400    CREATININE 0.66 05/12/2016 1400   CREATININE 0.75 11/05/2012 1546   CALCIUM 9.3 05/12/2016 1400   PROT 7.3 05/12/2016 1400   ALBUMIN 4.3 05/12/2016 1400   AST 22 05/12/2016 1400   ALT 21 05/12/2016 1400   ALKPHOS 70 05/12/2016 1400   BILITOT 0.3 05/12/2016 1400   GFRNONAA >60 05/12/2016 1400   GFRAA >60 05/12/2016 1400    TSH from 09/28/2016 was 0.448.  Recent Results (from the past 2160 hour(s))  TSH     Status: None   Collection Time: 04/15/18  3:11 PM  Result Value Ref Range   TSH 0.55 mIU/L    Comment:           Reference Range .           >  or = 20 Years  0.40-4.50 .                Pregnancy Ranges           First trimester    0.26-2.66           Second trimester   0.55-2.73           Third trimester    0.43-2.91   T4, free     Status: None   Collection Time: 04/15/18  3:11 PM  Result Value Ref Range   Free T4 1.1 0.8 - 1.8 ng/dL  T3, free     Status: None   Collection Time: 04/15/18  3:11 PM  Result Value Ref Range   T3, Free 2.5 2.3 - 4.2 pg/mL    April 23, 2018 thyroid ultrasound from [facility] imaging center: Right lobe 5.0 x 1.8 x 1.4 cm with no discrete nodules.  Left lobe 5.5 x 1.8 x 1.3 cm with no discrete nodules.  Assessment & Plan:   1. Subclinical hyperthyroidism-resolved -Her previsit thyroid function tests are all within normal limits.  Her thyroid ultrasound is consistent with mild goiter with no nodular lesions.  She would not require any intervention for thyroid function nor mild goiter.  -We will return in 1 year with repeat thyroid function tests for clinical evaluation again.  -She may benefit from ENT evaluation regarding her on and off dysphagia. -I did not initiate any new prescriptions for her today.  - I advised patient to maintain close follow up with Redmond School, MD for primary care needs. Follow up plan: Return in about 1 year (around 04/30/2019) for Follow up with Pre-visit Labs.  Glade Lloyd, MD [facility]  [place] [facility] Phone: [phone]  Fax: [phone]   04/30/2018, 5:04 PM This note was partially dictated with voice recognition software. Similar sounding words can be transcribed inadequately or may not  be corrected upon review.

## 2018-05-02 DIAGNOSIS — Z1389 Encounter for screening for other disorder: Secondary | ICD-10-CM | POA: Diagnosis not present

## 2018-05-02 DIAGNOSIS — Z6823 Body mass index (BMI) 23.0-23.9, adult: Secondary | ICD-10-CM | POA: Diagnosis not present

## 2018-05-02 DIAGNOSIS — G894 Chronic pain syndrome: Secondary | ICD-10-CM | POA: Diagnosis not present

## 2018-05-02 DIAGNOSIS — F900 Attention-deficit hyperactivity disorder, predominantly inattentive type: Secondary | ICD-10-CM | POA: Diagnosis not present

## 2018-05-02 DIAGNOSIS — J301 Allergic rhinitis due to pollen: Secondary | ICD-10-CM | POA: Diagnosis not present

## 2018-05-30 DIAGNOSIS — F419 Anxiety disorder, unspecified: Secondary | ICD-10-CM | POA: Diagnosis not present

## 2018-05-30 DIAGNOSIS — E063 Autoimmune thyroiditis: Secondary | ICD-10-CM | POA: Diagnosis not present

## 2018-05-30 DIAGNOSIS — G894 Chronic pain syndrome: Secondary | ICD-10-CM | POA: Diagnosis not present

## 2018-05-30 DIAGNOSIS — K121 Other forms of stomatitis: Secondary | ICD-10-CM | POA: Diagnosis not present

## 2018-06-24 DIAGNOSIS — J020 Streptococcal pharyngitis: Secondary | ICD-10-CM | POA: Diagnosis not present

## 2018-06-24 DIAGNOSIS — J029 Acute pharyngitis, unspecified: Secondary | ICD-10-CM | POA: Diagnosis not present

## 2018-06-24 DIAGNOSIS — Z681 Body mass index (BMI) 19 or less, adult: Secondary | ICD-10-CM | POA: Diagnosis not present

## 2018-08-16 ENCOUNTER — Telehealth: Payer: Self-pay | Admitting: *Deleted

## 2018-08-16 ENCOUNTER — Other Ambulatory Visit: Payer: Medicare Other

## 2018-08-16 DIAGNOSIS — R6889 Other general symptoms and signs: Secondary | ICD-10-CM | POA: Diagnosis not present

## 2018-08-16 DIAGNOSIS — J329 Chronic sinusitis, unspecified: Secondary | ICD-10-CM | POA: Diagnosis not present

## 2018-08-16 DIAGNOSIS — Z20822 Contact with and (suspected) exposure to covid-19: Secondary | ICD-10-CM

## 2018-08-16 DIAGNOSIS — Z681 Body mass index (BMI) 19 or less, adult: Secondary | ICD-10-CM | POA: Diagnosis not present

## 2018-08-16 NOTE — Telephone Encounter (Signed)
[place]- request COVID testing  Ordering provider: Rondell Reams  Phone: [phone]  Symptomatic- fever, ear ache, sinus pressure

## 2018-08-18 LAB — NOVEL CORONAVIRUS, NAA: SARS-CoV-2, NAA: NOT DETECTED

## 2018-08-26 DIAGNOSIS — M3390 Dermatopolymyositis, unspecified, organ involvement unspecified: Secondary | ICD-10-CM | POA: Diagnosis not present

## 2018-08-26 DIAGNOSIS — F900 Attention-deficit hyperactivity disorder, predominantly inattentive type: Secondary | ICD-10-CM | POA: Diagnosis not present

## 2018-08-26 DIAGNOSIS — G894 Chronic pain syndrome: Secondary | ICD-10-CM | POA: Diagnosis not present

## 2018-09-20 DIAGNOSIS — L255 Unspecified contact dermatitis due to plants, except food: Secondary | ICD-10-CM | POA: Diagnosis not present

## 2018-09-20 DIAGNOSIS — F909 Attention-deficit hyperactivity disorder, unspecified type: Secondary | ICD-10-CM | POA: Diagnosis not present

## 2018-09-20 DIAGNOSIS — G894 Chronic pain syndrome: Secondary | ICD-10-CM | POA: Diagnosis not present

## 2018-10-22 DIAGNOSIS — F900 Attention-deficit hyperactivity disorder, predominantly inattentive type: Secondary | ICD-10-CM | POA: Diagnosis not present

## 2018-10-22 DIAGNOSIS — G894 Chronic pain syndrome: Secondary | ICD-10-CM | POA: Diagnosis not present

## 2018-11-21 DIAGNOSIS — K219 Gastro-esophageal reflux disease without esophagitis: Secondary | ICD-10-CM | POA: Diagnosis not present

## 2018-11-21 DIAGNOSIS — F909 Attention-deficit hyperactivity disorder, unspecified type: Secondary | ICD-10-CM | POA: Diagnosis not present

## 2018-11-21 DIAGNOSIS — G4700 Insomnia, unspecified: Secondary | ICD-10-CM | POA: Diagnosis not present

## 2018-12-18 DIAGNOSIS — M3390 Dermatopolymyositis, unspecified, organ involvement unspecified: Secondary | ICD-10-CM | POA: Diagnosis not present

## 2018-12-18 DIAGNOSIS — J019 Acute sinusitis, unspecified: Secondary | ICD-10-CM | POA: Diagnosis not present

## 2018-12-18 DIAGNOSIS — F900 Attention-deficit hyperactivity disorder, predominantly inattentive type: Secondary | ICD-10-CM | POA: Diagnosis not present

## 2018-12-18 DIAGNOSIS — G894 Chronic pain syndrome: Secondary | ICD-10-CM | POA: Diagnosis not present

## 2018-12-28 DIAGNOSIS — F909 Attention-deficit hyperactivity disorder, unspecified type: Secondary | ICD-10-CM | POA: Diagnosis not present

## 2018-12-28 DIAGNOSIS — F419 Anxiety disorder, unspecified: Secondary | ICD-10-CM | POA: Diagnosis not present

## 2018-12-28 DIAGNOSIS — F329 Major depressive disorder, single episode, unspecified: Secondary | ICD-10-CM | POA: Diagnosis not present

## 2018-12-28 DIAGNOSIS — I10 Essential (primary) hypertension: Secondary | ICD-10-CM | POA: Diagnosis not present

## 2019-01-16 DIAGNOSIS — F900 Attention-deficit hyperactivity disorder, predominantly inattentive type: Secondary | ICD-10-CM | POA: Diagnosis not present

## 2019-01-16 DIAGNOSIS — G894 Chronic pain syndrome: Secondary | ICD-10-CM | POA: Diagnosis not present

## 2019-01-27 DIAGNOSIS — I10 Essential (primary) hypertension: Secondary | ICD-10-CM | POA: Diagnosis not present

## 2019-01-27 DIAGNOSIS — E7849 Other hyperlipidemia: Secondary | ICD-10-CM | POA: Diagnosis not present

## 2019-03-12 DIAGNOSIS — G894 Chronic pain syndrome: Secondary | ICD-10-CM | POA: Diagnosis not present

## 2019-03-12 DIAGNOSIS — J029 Acute pharyngitis, unspecified: Secondary | ICD-10-CM | POA: Diagnosis not present

## 2019-04-16 DIAGNOSIS — G894 Chronic pain syndrome: Secondary | ICD-10-CM | POA: Diagnosis not present

## 2019-04-16 DIAGNOSIS — F900 Attention-deficit hyperactivity disorder, predominantly inattentive type: Secondary | ICD-10-CM | POA: Diagnosis not present

## 2019-04-30 ENCOUNTER — Ambulatory Visit: Payer: Medicare Other | Admitting: "Endocrinology

## 2019-06-12 DIAGNOSIS — J300 Vasomotor rhinitis: Secondary | ICD-10-CM | POA: Diagnosis not present

## 2019-07-17 DIAGNOSIS — F900 Attention-deficit hyperactivity disorder, predominantly inattentive type: Secondary | ICD-10-CM | POA: Diagnosis not present

## 2019-07-17 DIAGNOSIS — G894 Chronic pain syndrome: Secondary | ICD-10-CM | POA: Diagnosis not present

## 2019-07-31 DIAGNOSIS — Z6824 Body mass index (BMI) 24.0-24.9, adult: Secondary | ICD-10-CM | POA: Diagnosis not present

## 2019-07-31 DIAGNOSIS — J209 Acute bronchitis, unspecified: Secondary | ICD-10-CM | POA: Diagnosis not present

## 2019-09-11 IMAGING — DX DG CHEST 2V
2 series · 2 of 2 positions shown · non-contrast
Comparison: CT chest 05/29/2016.

CLINICAL DATA: Sharp left-sided back pain extending to the
shoulder. Chest pain, unspecified type.

EXAM:
CHEST - 2 VIEW

[chest pa]
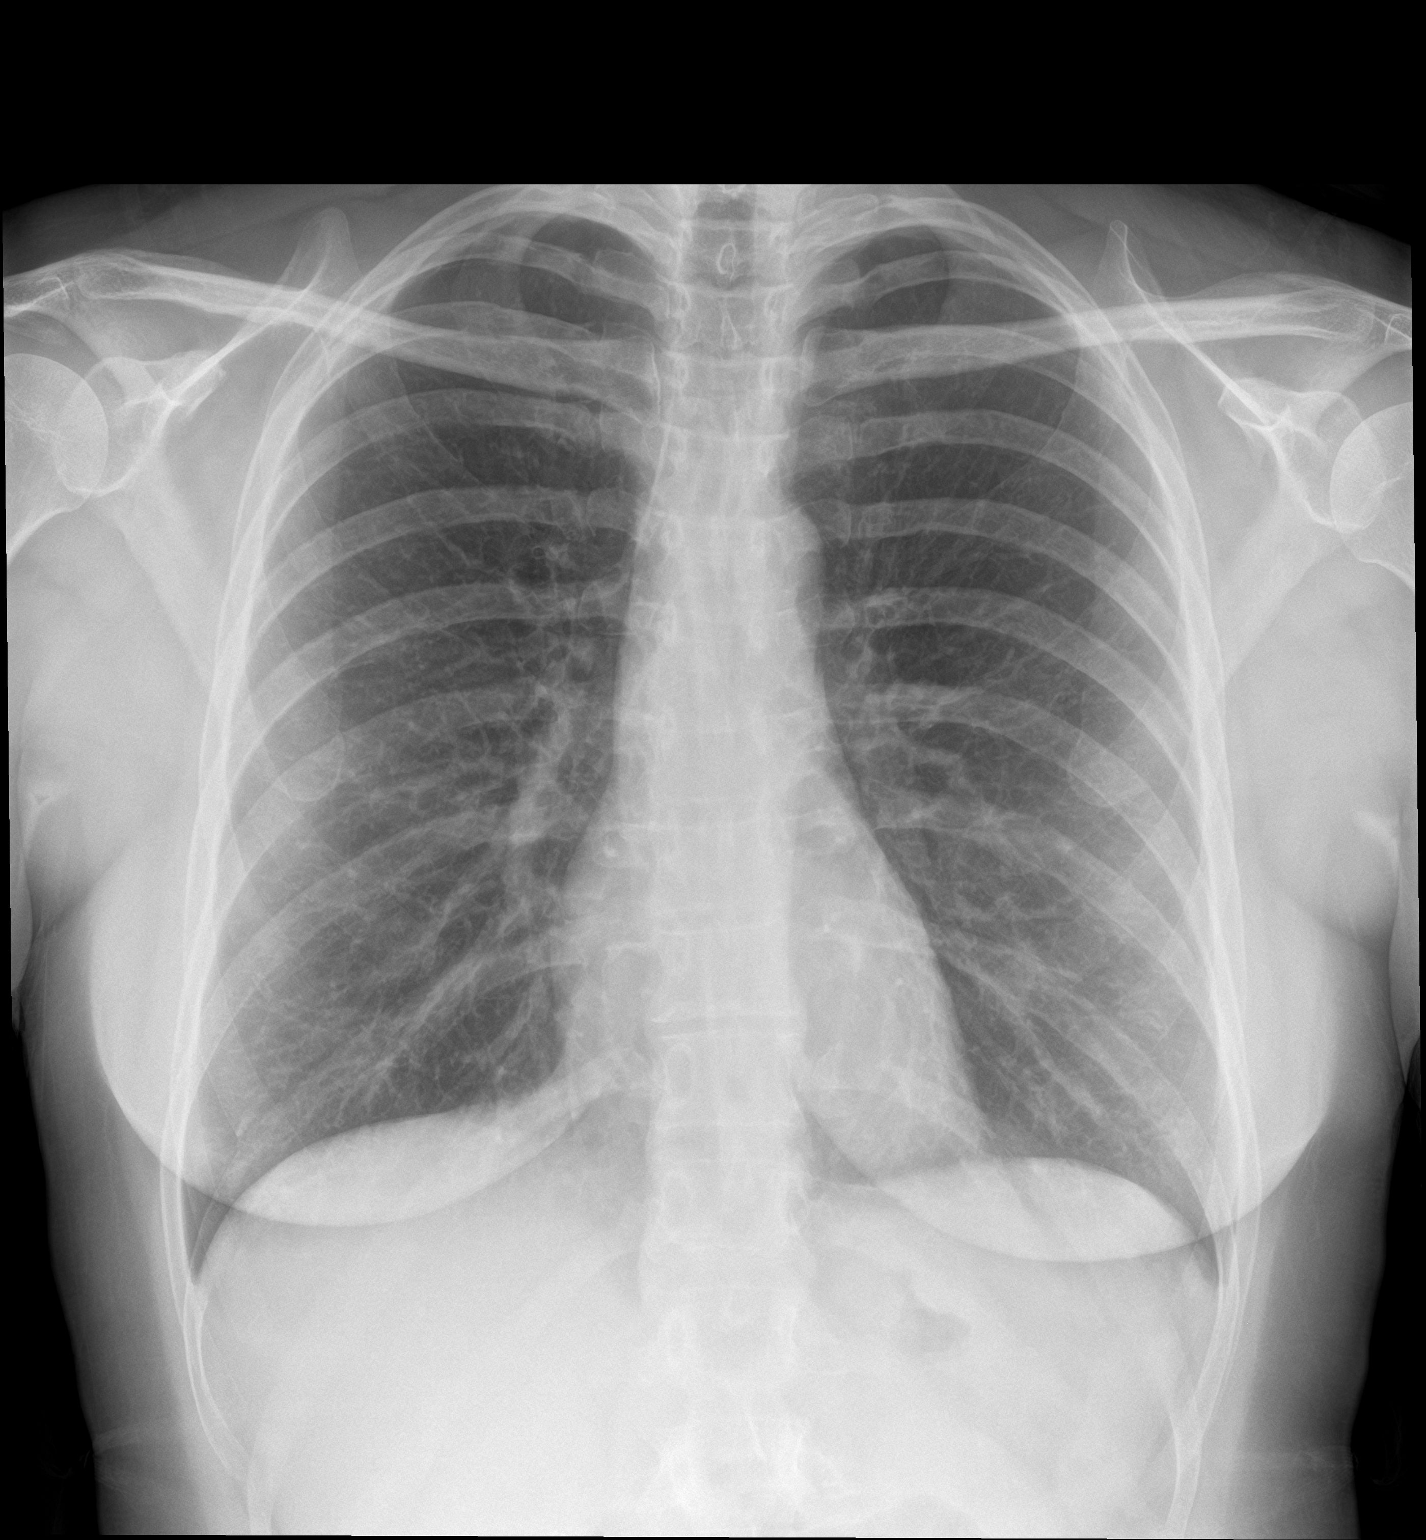

[chest lat]
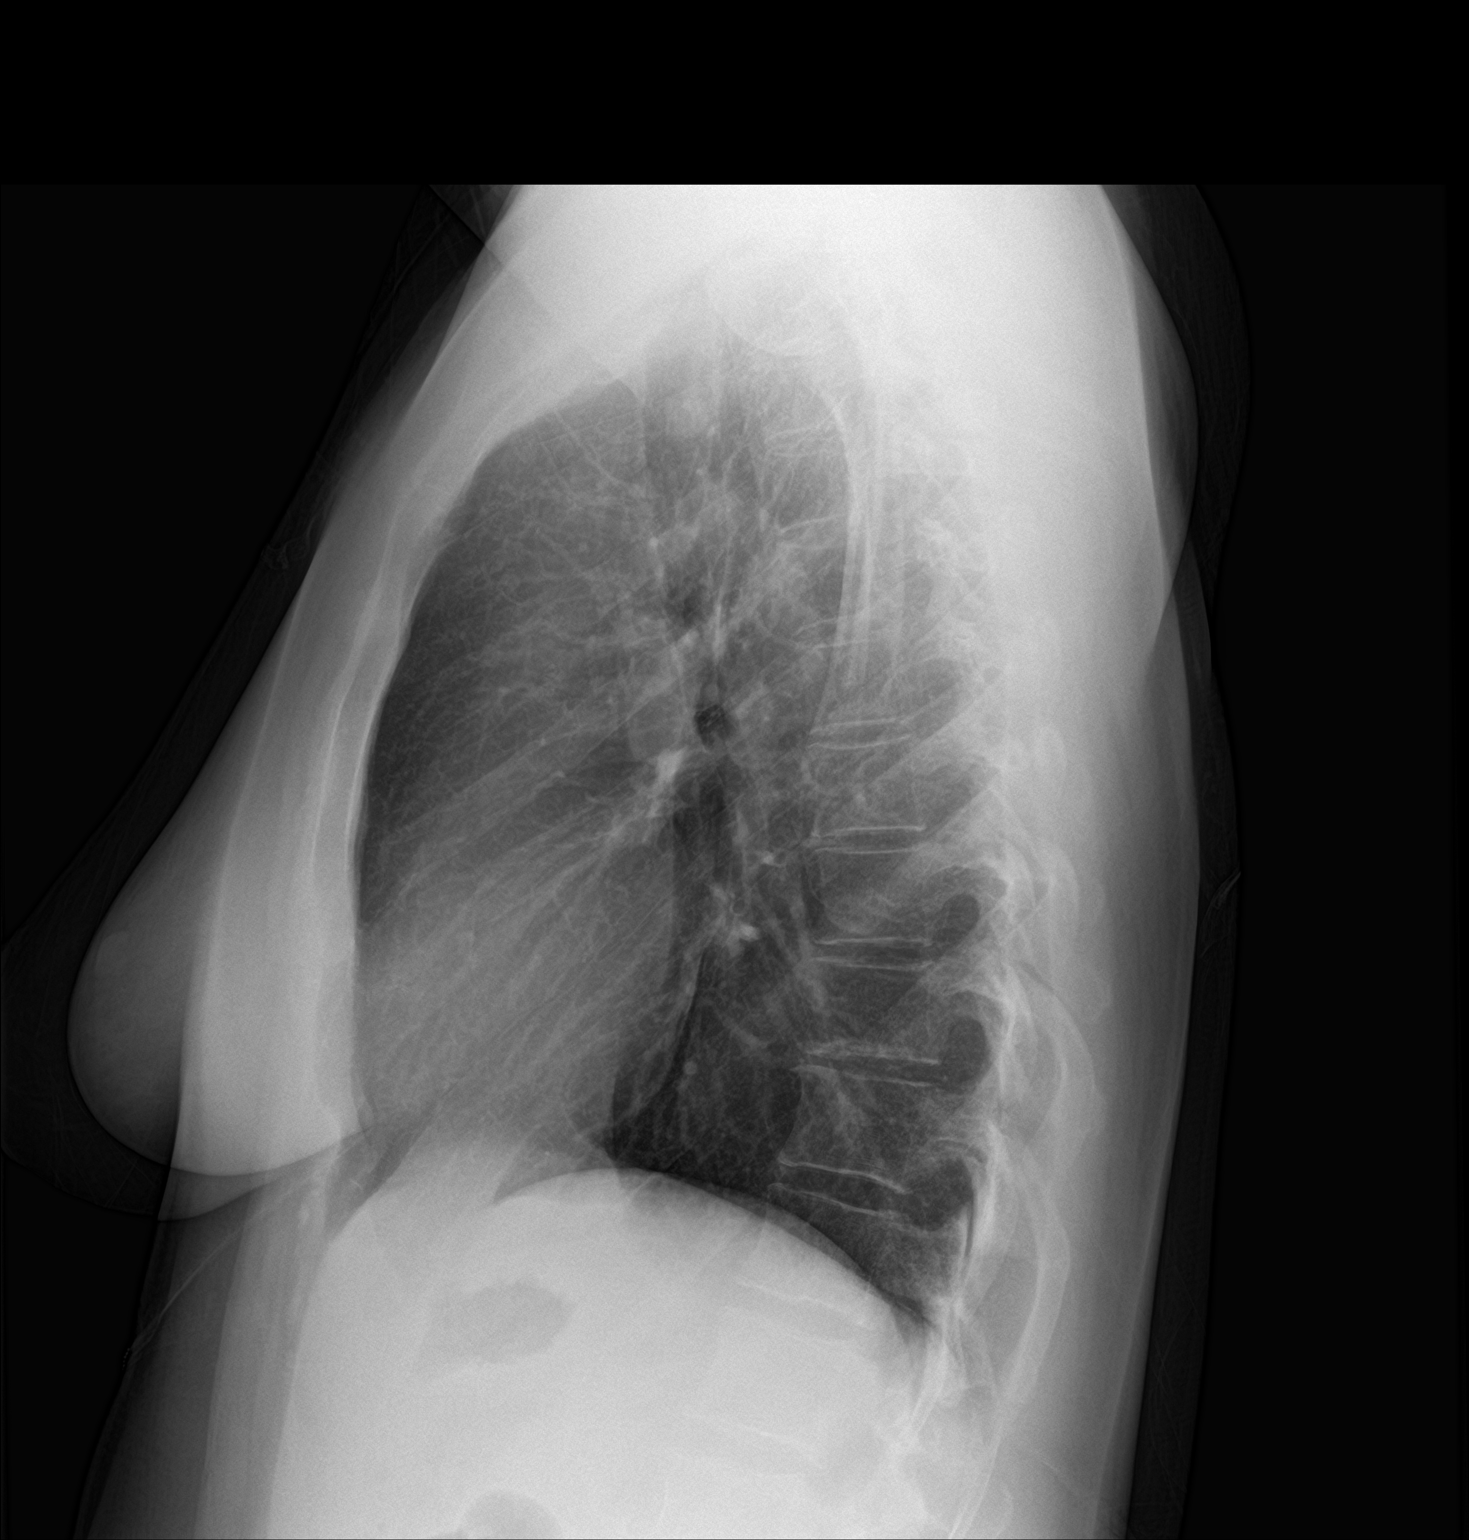

[2 of 2 positions shown; findings below may reference images not displayed]

FINDINGS: The heart size and mediastinal contours are within normal limits.
Both lungs are clear. The visualized skeletal structures are
unremarkable.
IMPRESSION: Negative two view chest x-ray

## 2019-09-25 DIAGNOSIS — I10 Essential (primary) hypertension: Secondary | ICD-10-CM | POA: Diagnosis not present

## 2019-09-25 DIAGNOSIS — K30 Functional dyspepsia: Secondary | ICD-10-CM | POA: Diagnosis not present

## 2019-09-25 DIAGNOSIS — R0789 Other chest pain: Secondary | ICD-10-CM | POA: Diagnosis not present

## 2019-09-25 DIAGNOSIS — R079 Chest pain, unspecified: Secondary | ICD-10-CM | POA: Diagnosis not present

## 2019-11-17 DIAGNOSIS — G894 Chronic pain syndrome: Secondary | ICD-10-CM | POA: Diagnosis not present

## 2019-11-17 DIAGNOSIS — N342 Other urethritis: Secondary | ICD-10-CM | POA: Diagnosis not present

## 2019-11-17 DIAGNOSIS — Z6822 Body mass index (BMI) 22.0-22.9, adult: Secondary | ICD-10-CM | POA: Diagnosis not present

## 2019-11-17 DIAGNOSIS — F900 Attention-deficit hyperactivity disorder, predominantly inattentive type: Secondary | ICD-10-CM | POA: Diagnosis not present

## 2019-11-17 DIAGNOSIS — M3390 Dermatopolymyositis, unspecified, organ involvement unspecified: Secondary | ICD-10-CM | POA: Diagnosis not present

## 2020-01-09 DIAGNOSIS — U071 COVID-19: Secondary | ICD-10-CM | POA: Diagnosis not present

## 2020-01-09 DIAGNOSIS — I10 Essential (primary) hypertension: Secondary | ICD-10-CM | POA: Diagnosis not present

## 2020-01-09 DIAGNOSIS — F909 Attention-deficit hyperactivity disorder, unspecified type: Secondary | ICD-10-CM | POA: Diagnosis not present

## 2020-01-09 DIAGNOSIS — G894 Chronic pain syndrome: Secondary | ICD-10-CM | POA: Diagnosis not present

## 2020-02-06 DIAGNOSIS — F909 Attention-deficit hyperactivity disorder, unspecified type: Secondary | ICD-10-CM | POA: Diagnosis not present

## 2020-02-06 DIAGNOSIS — G894 Chronic pain syndrome: Secondary | ICD-10-CM | POA: Diagnosis not present

## 2020-02-06 DIAGNOSIS — E063 Autoimmune thyroiditis: Secondary | ICD-10-CM | POA: Diagnosis not present

## 2020-02-06 DIAGNOSIS — I10 Essential (primary) hypertension: Secondary | ICD-10-CM | POA: Diagnosis not present

## 2020-03-09 DIAGNOSIS — R509 Fever, unspecified: Secondary | ICD-10-CM | POA: Diagnosis not present

## 2020-03-09 DIAGNOSIS — J329 Chronic sinusitis, unspecified: Secondary | ICD-10-CM | POA: Diagnosis not present

## 2020-03-09 DIAGNOSIS — R110 Nausea: Secondary | ICD-10-CM | POA: Diagnosis not present

## 2020-05-10 DIAGNOSIS — E782 Mixed hyperlipidemia: Secondary | ICD-10-CM | POA: Diagnosis not present

## 2020-05-10 DIAGNOSIS — I10 Essential (primary) hypertension: Secondary | ICD-10-CM | POA: Diagnosis not present

## 2020-05-10 DIAGNOSIS — Z0001 Encounter for general adult medical examination with abnormal findings: Secondary | ICD-10-CM | POA: Diagnosis not present

## 2020-05-10 DIAGNOSIS — Z6822 Body mass index (BMI) 22.0-22.9, adult: Secondary | ICD-10-CM | POA: Diagnosis not present

## 2020-05-10 DIAGNOSIS — K219 Gastro-esophageal reflux disease without esophagitis: Secondary | ICD-10-CM | POA: Diagnosis not present

## 2020-05-10 DIAGNOSIS — Z1389 Encounter for screening for other disorder: Secondary | ICD-10-CM | POA: Diagnosis not present

## 2020-05-10 DIAGNOSIS — F900 Attention-deficit hyperactivity disorder, predominantly inattentive type: Secondary | ICD-10-CM | POA: Diagnosis not present

## 2020-05-10 DIAGNOSIS — E039 Hypothyroidism, unspecified: Secondary | ICD-10-CM | POA: Diagnosis not present

## 2020-05-10 DIAGNOSIS — E7849 Other hyperlipidemia: Secondary | ICD-10-CM | POA: Diagnosis not present

## 2020-06-22 DIAGNOSIS — L089 Local infection of the skin and subcutaneous tissue, unspecified: Secondary | ICD-10-CM | POA: Diagnosis not present

## 2020-06-22 DIAGNOSIS — Z6822 Body mass index (BMI) 22.0-22.9, adult: Secondary | ICD-10-CM | POA: Diagnosis not present

## 2020-08-06 DIAGNOSIS — F909 Attention-deficit hyperactivity disorder, unspecified type: Secondary | ICD-10-CM | POA: Diagnosis not present

## 2020-08-06 DIAGNOSIS — G894 Chronic pain syndrome: Secondary | ICD-10-CM | POA: Diagnosis not present

## 2020-08-06 DIAGNOSIS — J329 Chronic sinusitis, unspecified: Secondary | ICD-10-CM | POA: Diagnosis not present

## 2020-09-06 DIAGNOSIS — F908 Attention-deficit hyperactivity disorder, other type: Secondary | ICD-10-CM | POA: Diagnosis not present

## 2020-09-06 DIAGNOSIS — G894 Chronic pain syndrome: Secondary | ICD-10-CM | POA: Diagnosis not present

## 2020-10-04 DIAGNOSIS — J22 Unspecified acute lower respiratory infection: Secondary | ICD-10-CM | POA: Diagnosis not present

## 2020-10-04 DIAGNOSIS — G894 Chronic pain syndrome: Secondary | ICD-10-CM | POA: Diagnosis not present

## 2020-10-04 DIAGNOSIS — F900 Attention-deficit hyperactivity disorder, predominantly inattentive type: Secondary | ICD-10-CM | POA: Diagnosis not present

## 2020-11-02 DIAGNOSIS — K219 Gastro-esophageal reflux disease without esophagitis: Secondary | ICD-10-CM | POA: Diagnosis not present

## 2020-11-02 DIAGNOSIS — Z6820 Body mass index (BMI) 20.0-20.9, adult: Secondary | ICD-10-CM | POA: Diagnosis not present

## 2020-11-02 DIAGNOSIS — I10 Essential (primary) hypertension: Secondary | ICD-10-CM | POA: Diagnosis not present

## 2020-11-02 DIAGNOSIS — G894 Chronic pain syndrome: Secondary | ICD-10-CM | POA: Diagnosis not present

## 2020-11-26 DIAGNOSIS — G894 Chronic pain syndrome: Secondary | ICD-10-CM | POA: Diagnosis not present

## 2020-11-26 DIAGNOSIS — J329 Chronic sinusitis, unspecified: Secondary | ICD-10-CM | POA: Diagnosis not present

## 2020-11-26 DIAGNOSIS — I10 Essential (primary) hypertension: Secondary | ICD-10-CM | POA: Diagnosis not present

## 2020-12-17 DIAGNOSIS — F419 Anxiety disorder, unspecified: Secondary | ICD-10-CM | POA: Diagnosis not present

## 2020-12-17 DIAGNOSIS — G894 Chronic pain syndrome: Secondary | ICD-10-CM | POA: Diagnosis not present

## 2020-12-17 DIAGNOSIS — E063 Autoimmune thyroiditis: Secondary | ICD-10-CM | POA: Diagnosis not present

## 2021-01-18 DIAGNOSIS — G894 Chronic pain syndrome: Secondary | ICD-10-CM | POA: Diagnosis not present

## 2021-01-18 DIAGNOSIS — I10 Essential (primary) hypertension: Secondary | ICD-10-CM | POA: Diagnosis not present

## 2021-01-18 DIAGNOSIS — E063 Autoimmune thyroiditis: Secondary | ICD-10-CM | POA: Diagnosis not present

## 2021-01-18 DIAGNOSIS — F4321 Adjustment disorder with depressed mood: Secondary | ICD-10-CM | POA: Diagnosis not present

## 2021-02-17 DIAGNOSIS — E063 Autoimmune thyroiditis: Secondary | ICD-10-CM | POA: Diagnosis not present

## 2021-02-17 DIAGNOSIS — F4321 Adjustment disorder with depressed mood: Secondary | ICD-10-CM | POA: Diagnosis not present

## 2021-02-17 DIAGNOSIS — F900 Attention-deficit hyperactivity disorder, predominantly inattentive type: Secondary | ICD-10-CM | POA: Diagnosis not present

## 2021-02-17 DIAGNOSIS — G894 Chronic pain syndrome: Secondary | ICD-10-CM | POA: Diagnosis not present

## 2021-03-14 DIAGNOSIS — G894 Chronic pain syndrome: Secondary | ICD-10-CM | POA: Diagnosis not present

## 2021-03-14 DIAGNOSIS — F4321 Adjustment disorder with depressed mood: Secondary | ICD-10-CM | POA: Diagnosis not present

## 2021-03-14 DIAGNOSIS — Z6820 Body mass index (BMI) 20.0-20.9, adult: Secondary | ICD-10-CM | POA: Diagnosis not present

## 2021-03-14 DIAGNOSIS — J329 Chronic sinusitis, unspecified: Secondary | ICD-10-CM | POA: Diagnosis not present

## 2021-04-26 DIAGNOSIS — J329 Chronic sinusitis, unspecified: Secondary | ICD-10-CM | POA: Diagnosis not present

## 2021-04-26 DIAGNOSIS — G894 Chronic pain syndrome: Secondary | ICD-10-CM | POA: Diagnosis not present

## 2021-04-26 DIAGNOSIS — F419 Anxiety disorder, unspecified: Secondary | ICD-10-CM | POA: Diagnosis not present

## 2021-04-26 DIAGNOSIS — F900 Attention-deficit hyperactivity disorder, predominantly inattentive type: Secondary | ICD-10-CM | POA: Diagnosis not present

## 2021-05-16 DIAGNOSIS — Z1331 Encounter for screening for depression: Secondary | ICD-10-CM | POA: Diagnosis not present

## 2021-05-16 DIAGNOSIS — Z6820 Body mass index (BMI) 20.0-20.9, adult: Secondary | ICD-10-CM | POA: Diagnosis not present

## 2021-05-16 DIAGNOSIS — N390 Urinary tract infection, site not specified: Secondary | ICD-10-CM | POA: Diagnosis not present

## 2021-05-16 DIAGNOSIS — Z0001 Encounter for general adult medical examination with abnormal findings: Secondary | ICD-10-CM | POA: Diagnosis not present

## 2021-05-16 DIAGNOSIS — E782 Mixed hyperlipidemia: Secondary | ICD-10-CM | POA: Diagnosis not present

## 2021-05-16 DIAGNOSIS — F900 Attention-deficit hyperactivity disorder, predominantly inattentive type: Secondary | ICD-10-CM | POA: Diagnosis not present

## 2021-05-16 DIAGNOSIS — F419 Anxiety disorder, unspecified: Secondary | ICD-10-CM | POA: Diagnosis not present

## 2021-05-16 DIAGNOSIS — F329 Major depressive disorder, single episode, unspecified: Secondary | ICD-10-CM | POA: Diagnosis not present

## 2021-05-16 DIAGNOSIS — G894 Chronic pain syndrome: Secondary | ICD-10-CM | POA: Diagnosis not present

## 2021-05-16 DIAGNOSIS — E063 Autoimmune thyroiditis: Secondary | ICD-10-CM | POA: Diagnosis not present

## 2021-05-16 DIAGNOSIS — I10 Essential (primary) hypertension: Secondary | ICD-10-CM | POA: Diagnosis not present

## 2021-05-16 DIAGNOSIS — J329 Chronic sinusitis, unspecified: Secondary | ICD-10-CM | POA: Diagnosis not present

## 2021-05-23 DIAGNOSIS — R42 Dizziness and giddiness: Secondary | ICD-10-CM | POA: Diagnosis not present

## 2021-05-23 DIAGNOSIS — H748X3 Other specified disorders of middle ear and mastoid, bilateral: Secondary | ICD-10-CM | POA: Diagnosis not present

## 2021-05-23 DIAGNOSIS — Z87891 Personal history of nicotine dependence: Secondary | ICD-10-CM | POA: Diagnosis not present

## 2021-05-23 DIAGNOSIS — I10 Essential (primary) hypertension: Secondary | ICD-10-CM | POA: Diagnosis not present

## 2021-05-23 DIAGNOSIS — M3313 Other dermatomyositis without myopathy: Secondary | ICD-10-CM | POA: Diagnosis not present

## 2021-05-23 DIAGNOSIS — Z7982 Long term (current) use of aspirin: Secondary | ICD-10-CM | POA: Diagnosis not present

## 2021-05-23 DIAGNOSIS — E063 Autoimmune thyroiditis: Secondary | ICD-10-CM | POA: Diagnosis not present

## 2021-05-23 DIAGNOSIS — H70003 Acute mastoiditis without complications, bilateral: Secondary | ICD-10-CM | POA: Diagnosis not present

## 2021-05-23 DIAGNOSIS — N390 Urinary tract infection, site not specified: Secondary | ICD-10-CM | POA: Diagnosis not present

## 2021-06-24 DIAGNOSIS — I10 Essential (primary) hypertension: Secondary | ICD-10-CM | POA: Diagnosis not present

## 2021-06-24 DIAGNOSIS — F419 Anxiety disorder, unspecified: Secondary | ICD-10-CM | POA: Diagnosis not present

## 2021-06-24 DIAGNOSIS — G894 Chronic pain syndrome: Secondary | ICD-10-CM | POA: Diagnosis not present

## 2021-06-24 DIAGNOSIS — N183 Chronic kidney disease, stage 3 unspecified: Secondary | ICD-10-CM | POA: Diagnosis not present

## 2021-07-14 DIAGNOSIS — F900 Attention-deficit hyperactivity disorder, predominantly inattentive type: Secondary | ICD-10-CM | POA: Diagnosis not present

## 2021-07-14 DIAGNOSIS — G894 Chronic pain syndrome: Secondary | ICD-10-CM | POA: Diagnosis not present

## 2021-07-14 DIAGNOSIS — I10 Essential (primary) hypertension: Secondary | ICD-10-CM | POA: Diagnosis not present

## 2021-07-14 DIAGNOSIS — F419 Anxiety disorder, unspecified: Secondary | ICD-10-CM | POA: Diagnosis not present

## 2021-07-16 DIAGNOSIS — R319 Hematuria, unspecified: Secondary | ICD-10-CM | POA: Diagnosis not present

## 2021-07-16 DIAGNOSIS — I129 Hypertensive chronic kidney disease with stage 1 through stage 4 chronic kidney disease, or unspecified chronic kidney disease: Secondary | ICD-10-CM | POA: Diagnosis not present

## 2021-07-16 DIAGNOSIS — R809 Proteinuria, unspecified: Secondary | ICD-10-CM | POA: Diagnosis not present

## 2021-07-16 DIAGNOSIS — M3313 Other dermatomyositis without myopathy: Secondary | ICD-10-CM | POA: Diagnosis not present

## 2021-07-16 DIAGNOSIS — N189 Chronic kidney disease, unspecified: Secondary | ICD-10-CM | POA: Diagnosis not present

## 2021-07-16 DIAGNOSIS — Z7729 Contact with and (suspected ) exposure to other hazardous substances: Secondary | ICD-10-CM | POA: Diagnosis not present

## 2021-07-16 DIAGNOSIS — N170 Acute kidney failure with tubular necrosis: Secondary | ICD-10-CM | POA: Diagnosis not present

## 2021-07-19 ENCOUNTER — Other Ambulatory Visit (HOSPITAL_COMMUNITY): Payer: Self-pay | Admitting: Nephrology

## 2021-07-19 ENCOUNTER — Other Ambulatory Visit: Payer: Self-pay | Admitting: Nephrology

## 2021-07-19 DIAGNOSIS — I129 Hypertensive chronic kidney disease with stage 1 through stage 4 chronic kidney disease, or unspecified chronic kidney disease: Secondary | ICD-10-CM

## 2021-07-19 DIAGNOSIS — N170 Acute kidney failure with tubular necrosis: Secondary | ICD-10-CM

## 2021-07-19 DIAGNOSIS — R809 Proteinuria, unspecified: Secondary | ICD-10-CM

## 2021-07-19 DIAGNOSIS — N189 Chronic kidney disease, unspecified: Secondary | ICD-10-CM

## 2021-08-24 ENCOUNTER — Ambulatory Visit (HOSPITAL_COMMUNITY): Payer: Medicare Other

## 2021-08-24 ENCOUNTER — Encounter (HOSPITAL_COMMUNITY): Payer: Self-pay

## 2021-09-14 ENCOUNTER — Ambulatory Visit (HOSPITAL_COMMUNITY)
Admission: RE | Admit: 2021-09-14 | Discharge: 2021-09-14 | Disposition: A | Discharge status: Home / Self Care | Payer: Medicare Other | Source: Ambulatory Visit | Attending: Nephrology | Admitting: Nephrology

## 2021-09-14 DIAGNOSIS — N170 Acute kidney failure with tubular necrosis: Secondary | ICD-10-CM

## 2021-09-14 DIAGNOSIS — I129 Hypertensive chronic kidney disease with stage 1 through stage 4 chronic kidney disease, or unspecified chronic kidney disease: Secondary | ICD-10-CM

## 2021-09-14 DIAGNOSIS — R809 Proteinuria, unspecified: Secondary | ICD-10-CM

## 2021-09-14 DIAGNOSIS — N189 Chronic kidney disease, unspecified: Secondary | ICD-10-CM | POA: Diagnosis not present

## 2021-09-14 DIAGNOSIS — Z6821 Body mass index (BMI) 21.0-21.9, adult: Secondary | ICD-10-CM | POA: Diagnosis not present

## 2021-09-14 DIAGNOSIS — G894 Chronic pain syndrome: Secondary | ICD-10-CM | POA: Diagnosis not present

## 2021-10-13 DIAGNOSIS — N183 Chronic kidney disease, stage 3 unspecified: Secondary | ICD-10-CM | POA: Diagnosis not present

## 2021-10-13 DIAGNOSIS — N390 Urinary tract infection, site not specified: Secondary | ICD-10-CM | POA: Diagnosis not present

## 2021-10-13 DIAGNOSIS — Z6821 Body mass index (BMI) 21.0-21.9, adult: Secondary | ICD-10-CM | POA: Diagnosis not present

## 2021-10-13 DIAGNOSIS — G894 Chronic pain syndrome: Secondary | ICD-10-CM | POA: Diagnosis not present

## 2021-10-17 DIAGNOSIS — E063 Autoimmune thyroiditis: Secondary | ICD-10-CM | POA: Diagnosis not present

## 2021-10-17 DIAGNOSIS — F900 Attention-deficit hyperactivity disorder, predominantly inattentive type: Secondary | ICD-10-CM | POA: Diagnosis not present

## 2021-10-17 DIAGNOSIS — I10 Essential (primary) hypertension: Secondary | ICD-10-CM | POA: Diagnosis not present

## 2021-11-09 DIAGNOSIS — J329 Chronic sinusitis, unspecified: Secondary | ICD-10-CM | POA: Diagnosis not present

## 2021-12-15 DIAGNOSIS — F900 Attention-deficit hyperactivity disorder, predominantly inattentive type: Secondary | ICD-10-CM | POA: Diagnosis not present

## 2021-12-15 DIAGNOSIS — I10 Essential (primary) hypertension: Secondary | ICD-10-CM | POA: Diagnosis not present

## 2021-12-15 DIAGNOSIS — M3390 Dermatopolymyositis, unspecified, organ involvement unspecified: Secondary | ICD-10-CM | POA: Diagnosis not present

## 2021-12-15 DIAGNOSIS — E063 Autoimmune thyroiditis: Secondary | ICD-10-CM | POA: Diagnosis not present

## 2021-12-26 DIAGNOSIS — R109 Unspecified abdominal pain: Secondary | ICD-10-CM | POA: Diagnosis not present

## 2021-12-26 DIAGNOSIS — N390 Urinary tract infection, site not specified: Secondary | ICD-10-CM | POA: Diagnosis not present

## 2021-12-26 DIAGNOSIS — Z87440 Personal history of urinary (tract) infections: Secondary | ICD-10-CM | POA: Diagnosis not present

## 2021-12-26 DIAGNOSIS — Z87442 Personal history of urinary calculi: Secondary | ICD-10-CM | POA: Diagnosis not present

## 2021-12-26 DIAGNOSIS — E063 Autoimmune thyroiditis: Secondary | ICD-10-CM | POA: Diagnosis not present

## 2021-12-26 DIAGNOSIS — M3313 Other dermatomyositis without myopathy: Secondary | ICD-10-CM | POA: Diagnosis not present

## 2021-12-26 DIAGNOSIS — I10 Essential (primary) hypertension: Secondary | ICD-10-CM | POA: Diagnosis not present

## 2021-12-26 DIAGNOSIS — Z87891 Personal history of nicotine dependence: Secondary | ICD-10-CM | POA: Diagnosis not present

## 2021-12-26 DIAGNOSIS — I700 Atherosclerosis of aorta: Secondary | ICD-10-CM | POA: Diagnosis not present

## 2021-12-26 DIAGNOSIS — R079 Chest pain, unspecified: Secondary | ICD-10-CM | POA: Diagnosis not present

## 2022-01-10 ENCOUNTER — Encounter (INDEPENDENT_AMBULATORY_CARE_PROVIDER_SITE_OTHER): Payer: Self-pay | Admitting: Gastroenterology

## 2022-02-08 ENCOUNTER — Other Ambulatory Visit: Payer: Self-pay | Admitting: Otolaryngology

## 2022-02-08 DIAGNOSIS — J342 Deviated nasal septum: Secondary | ICD-10-CM | POA: Diagnosis not present

## 2022-02-08 DIAGNOSIS — J329 Chronic sinusitis, unspecified: Secondary | ICD-10-CM

## 2022-02-08 DIAGNOSIS — J343 Hypertrophy of nasal turbinates: Secondary | ICD-10-CM | POA: Diagnosis not present

## 2022-02-08 DIAGNOSIS — H9042 Sensorineural hearing loss, unilateral, left ear, with unrestricted hearing on the contralateral side: Secondary | ICD-10-CM | POA: Diagnosis not present

## 2022-02-08 DIAGNOSIS — J310 Chronic rhinitis: Secondary | ICD-10-CM | POA: Diagnosis not present

## 2022-02-08 DIAGNOSIS — H838X2 Other specified diseases of left inner ear: Secondary | ICD-10-CM | POA: Diagnosis not present

## 2022-02-10 DIAGNOSIS — M130 Polyarthritis, unspecified: Secondary | ICD-10-CM | POA: Diagnosis not present

## 2022-02-10 DIAGNOSIS — I10 Essential (primary) hypertension: Secondary | ICD-10-CM | POA: Diagnosis not present

## 2022-02-10 DIAGNOSIS — F900 Attention-deficit hyperactivity disorder, predominantly inattentive type: Secondary | ICD-10-CM | POA: Diagnosis not present

## 2022-02-10 DIAGNOSIS — N183 Chronic kidney disease, stage 3 unspecified: Secondary | ICD-10-CM | POA: Diagnosis not present

## 2022-02-10 DIAGNOSIS — Z6822 Body mass index (BMI) 22.0-22.9, adult: Secondary | ICD-10-CM | POA: Diagnosis not present

## 2022-02-10 DIAGNOSIS — E063 Autoimmune thyroiditis: Secondary | ICD-10-CM | POA: Diagnosis not present

## 2022-02-10 DIAGNOSIS — M3390 Dermatopolymyositis, unspecified, organ involvement unspecified: Secondary | ICD-10-CM | POA: Diagnosis not present

## 2022-02-13 ENCOUNTER — Other Ambulatory Visit (HOSPITAL_COMMUNITY): Payer: Self-pay | Admitting: Otolaryngology

## 2022-02-13 DIAGNOSIS — J329 Chronic sinusitis, unspecified: Secondary | ICD-10-CM

## 2022-03-07 ENCOUNTER — Ambulatory Visit (HOSPITAL_COMMUNITY)
Admission: RE | Admit: 2022-03-07 | Discharge: 2022-03-07 | Disposition: A | Discharge status: Home / Self Care | Payer: Medicare Other | Source: Ambulatory Visit | Attending: Otolaryngology | Admitting: Otolaryngology

## 2022-03-07 DIAGNOSIS — J341 Cyst and mucocele of nose and nasal sinus: Secondary | ICD-10-CM | POA: Diagnosis not present

## 2022-03-07 DIAGNOSIS — J329 Chronic sinusitis, unspecified: Secondary | ICD-10-CM | POA: Diagnosis not present

## 2022-03-07 DIAGNOSIS — J342 Deviated nasal septum: Secondary | ICD-10-CM | POA: Diagnosis not present

## 2022-03-11 DIAGNOSIS — Z87891 Personal history of nicotine dependence: Secondary | ICD-10-CM | POA: Diagnosis not present

## 2022-03-11 DIAGNOSIS — Z79899 Other long term (current) drug therapy: Secondary | ICD-10-CM | POA: Diagnosis not present

## 2022-03-11 DIAGNOSIS — I10 Essential (primary) hypertension: Secondary | ICD-10-CM | POA: Diagnosis not present

## 2022-03-11 DIAGNOSIS — M3390 Dermatopolymyositis, unspecified, organ involvement unspecified: Secondary | ICD-10-CM | POA: Diagnosis not present

## 2022-03-11 DIAGNOSIS — M3313 Other dermatomyositis without myopathy: Secondary | ICD-10-CM | POA: Diagnosis not present

## 2022-03-11 DIAGNOSIS — R531 Weakness: Secondary | ICD-10-CM | POA: Diagnosis not present

## 2022-03-11 DIAGNOSIS — R079 Chest pain, unspecified: Secondary | ICD-10-CM | POA: Diagnosis not present

## 2022-03-12 DIAGNOSIS — R079 Chest pain, unspecified: Secondary | ICD-10-CM | POA: Diagnosis not present

## 2022-03-12 DIAGNOSIS — R531 Weakness: Secondary | ICD-10-CM | POA: Diagnosis not present

## 2022-03-20 DIAGNOSIS — J310 Chronic rhinitis: Secondary | ICD-10-CM | POA: Diagnosis not present

## 2022-03-20 DIAGNOSIS — J343 Hypertrophy of nasal turbinates: Secondary | ICD-10-CM | POA: Diagnosis not present

## 2022-03-20 DIAGNOSIS — J342 Deviated nasal septum: Secondary | ICD-10-CM | POA: Diagnosis not present

## 2022-03-28 DIAGNOSIS — J343 Hypertrophy of nasal turbinates: Secondary | ICD-10-CM | POA: Diagnosis not present

## 2022-03-28 DIAGNOSIS — J3489 Other specified disorders of nose and nasal sinuses: Secondary | ICD-10-CM | POA: Diagnosis not present

## 2022-03-28 DIAGNOSIS — J342 Deviated nasal septum: Secondary | ICD-10-CM | POA: Diagnosis not present

## 2022-04-03 DIAGNOSIS — E063 Autoimmune thyroiditis: Secondary | ICD-10-CM | POA: Diagnosis not present

## 2022-04-03 DIAGNOSIS — K219 Gastro-esophageal reflux disease without esophagitis: Secondary | ICD-10-CM | POA: Diagnosis not present

## 2022-04-03 DIAGNOSIS — R0602 Shortness of breath: Secondary | ICD-10-CM | POA: Diagnosis not present

## 2022-04-03 DIAGNOSIS — Z7982 Long term (current) use of aspirin: Secondary | ICD-10-CM | POA: Diagnosis not present

## 2022-04-03 DIAGNOSIS — I10 Essential (primary) hypertension: Secondary | ICD-10-CM | POA: Diagnosis not present

## 2022-04-03 DIAGNOSIS — M3313 Other dermatomyositis without myopathy: Secondary | ICD-10-CM | POA: Diagnosis not present

## 2022-04-03 DIAGNOSIS — R072 Precordial pain: Secondary | ICD-10-CM | POA: Diagnosis not present

## 2022-04-03 DIAGNOSIS — Z87891 Personal history of nicotine dependence: Secondary | ICD-10-CM | POA: Diagnosis not present

## 2022-04-03 DIAGNOSIS — R079 Chest pain, unspecified: Secondary | ICD-10-CM | POA: Diagnosis not present

## 2022-04-03 DIAGNOSIS — I319 Disease of pericardium, unspecified: Secondary | ICD-10-CM | POA: Diagnosis not present

## 2022-04-04 DIAGNOSIS — I301 Infective pericarditis: Secondary | ICD-10-CM | POA: Diagnosis not present

## 2022-04-04 DIAGNOSIS — I10 Essential (primary) hypertension: Secondary | ICD-10-CM | POA: Diagnosis not present

## 2022-04-04 DIAGNOSIS — G894 Chronic pain syndrome: Secondary | ICD-10-CM | POA: Diagnosis not present

## 2022-04-04 DIAGNOSIS — M3390 Dermatopolymyositis, unspecified, organ involvement unspecified: Secondary | ICD-10-CM | POA: Diagnosis not present

## 2022-04-04 DIAGNOSIS — Z6823 Body mass index (BMI) 23.0-23.9, adult: Secondary | ICD-10-CM | POA: Diagnosis not present

## 2022-04-04 DIAGNOSIS — F900 Attention-deficit hyperactivity disorder, predominantly inattentive type: Secondary | ICD-10-CM | POA: Diagnosis not present

## 2022-05-05 DIAGNOSIS — F900 Attention-deficit hyperactivity disorder, predominantly inattentive type: Secondary | ICD-10-CM | POA: Diagnosis not present

## 2022-05-05 DIAGNOSIS — G894 Chronic pain syndrome: Secondary | ICD-10-CM | POA: Diagnosis not present

## 2022-05-05 DIAGNOSIS — M3390 Dermatopolymyositis, unspecified, organ involvement unspecified: Secondary | ICD-10-CM | POA: Diagnosis not present

## 2022-05-05 DIAGNOSIS — I10 Essential (primary) hypertension: Secondary | ICD-10-CM | POA: Diagnosis not present

## 2022-05-23 DIAGNOSIS — Z87891 Personal history of nicotine dependence: Secondary | ICD-10-CM | POA: Diagnosis not present

## 2022-05-23 DIAGNOSIS — Z885 Allergy status to narcotic agent status: Secondary | ICD-10-CM | POA: Diagnosis not present

## 2022-05-23 DIAGNOSIS — Z20822 Contact with and (suspected) exposure to covid-19: Secondary | ICD-10-CM | POA: Diagnosis not present

## 2022-05-23 DIAGNOSIS — Z1152 Encounter for screening for COVID-19: Secondary | ICD-10-CM | POA: Diagnosis not present

## 2022-05-23 DIAGNOSIS — J180 Bronchopneumonia, unspecified organism: Secondary | ICD-10-CM | POA: Diagnosis not present

## 2022-06-01 DIAGNOSIS — M130 Polyarthritis, unspecified: Secondary | ICD-10-CM | POA: Diagnosis not present

## 2022-06-01 DIAGNOSIS — J329 Chronic sinusitis, unspecified: Secondary | ICD-10-CM | POA: Diagnosis not present

## 2022-06-01 DIAGNOSIS — I10 Essential (primary) hypertension: Secondary | ICD-10-CM | POA: Diagnosis not present

## 2022-06-01 DIAGNOSIS — G894 Chronic pain syndrome: Secondary | ICD-10-CM | POA: Diagnosis not present

## 2022-06-20 ENCOUNTER — Ambulatory Visit: Payer: Medicare Other | Admitting: Interventional Cardiology

## 2022-06-22 DIAGNOSIS — G9332 Myalgic encephalomyelitis/chronic fatigue syndrome: Secondary | ICD-10-CM | POA: Diagnosis not present

## 2022-06-22 DIAGNOSIS — F900 Attention-deficit hyperactivity disorder, predominantly inattentive type: Secondary | ICD-10-CM | POA: Diagnosis not present

## 2022-06-22 DIAGNOSIS — Z0001 Encounter for general adult medical examination with abnormal findings: Secondary | ICD-10-CM | POA: Diagnosis not present

## 2022-06-22 DIAGNOSIS — D518 Other vitamin B12 deficiency anemias: Secondary | ICD-10-CM | POA: Diagnosis not present

## 2022-06-22 DIAGNOSIS — M3390 Dermatopolymyositis, unspecified, organ involvement unspecified: Secondary | ICD-10-CM | POA: Diagnosis not present

## 2022-06-22 DIAGNOSIS — I10 Essential (primary) hypertension: Secondary | ICD-10-CM | POA: Diagnosis not present

## 2022-06-22 DIAGNOSIS — Z6823 Body mass index (BMI) 23.0-23.9, adult: Secondary | ICD-10-CM | POA: Diagnosis not present

## 2022-06-22 DIAGNOSIS — E559 Vitamin D deficiency, unspecified: Secondary | ICD-10-CM | POA: Diagnosis not present

## 2022-06-22 DIAGNOSIS — Z1331 Encounter for screening for depression: Secondary | ICD-10-CM | POA: Diagnosis not present

## 2022-06-22 DIAGNOSIS — M130 Polyarthritis, unspecified: Secondary | ICD-10-CM | POA: Diagnosis not present

## 2022-06-22 DIAGNOSIS — N183 Chronic kidney disease, stage 3 unspecified: Secondary | ICD-10-CM | POA: Diagnosis not present

## 2022-06-22 DIAGNOSIS — G894 Chronic pain syndrome: Secondary | ICD-10-CM | POA: Diagnosis not present

## 2022-07-06 ENCOUNTER — Encounter: Payer: Self-pay | Admitting: *Deleted

## 2022-07-07 ENCOUNTER — Telehealth: Payer: Self-pay | Admitting: Internal Medicine

## 2022-07-07 ENCOUNTER — Encounter: Payer: Self-pay | Admitting: Internal Medicine

## 2022-07-07 ENCOUNTER — Other Ambulatory Visit: Payer: Self-pay | Admitting: Internal Medicine

## 2022-07-07 ENCOUNTER — Ambulatory Visit: Payer: Medicare Other | Attending: Internal Medicine

## 2022-07-07 ENCOUNTER — Ambulatory Visit: Payer: Medicare Other | Attending: Interventional Cardiology | Admitting: Internal Medicine

## 2022-07-07 VITALS — BP 150/100 | HR 80 | Ht 69.0 in | Wt 158.6 lb

## 2022-07-07 DIAGNOSIS — Z8679 Personal history of other diseases of the circulatory system: Secondary | ICD-10-CM | POA: Insufficient documentation

## 2022-07-07 DIAGNOSIS — R002 Palpitations: Secondary | ICD-10-CM | POA: Insufficient documentation

## 2022-07-07 NOTE — Progress Notes (Signed)
Cardiology Office Note  Date: 07/07/2022   ID: Kathryn Austin, DOB [birth year], MRN [id number]  PCP:  Elfredia Nevins, MD  Cardiologist:  Marjo Bicker, MD Electrophysiologist:  None   Reason for Office Visit: Evaluation chest pain at the request of Dr. Carlena Sax   History of Present Illness: Kathryn Austin is a 47 y.o. female known to have dermatomyositis on methotrexate and hydroxychloroquine was referred to cardiology clinic for evaluation chest pain.  Patient went to [facility] ER in 03/2022 for evaluation of chest pain and was diagnosed with pericarditis. She was discharged on prednisone and PPI which resolved the pain completely. Bedside ultrasound showed trivial medical effusion. She had no recurrence of chest pain since then.  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  She said she wanted to see cardiologist due to her autoimmune issues of dermatomyositis and make sure her heart is okay.  She reported having fluttering of her heart frequently especially at nighttime and happens few times per week.  This has been ongoing for the last 6 months.  No other symptoms of syncope, leg swelling or DOE.   Past Medical History:  Diagnosis Date   Active smoker 2014   Anxiety    Dermatomyositis ([facility])    GERD (gastroesophageal reflux disease)    Hypertension    Neuromuscular disorder ([facility])    dramata myocytis    Past Surgical History:  Procedure Laterality Date   ABDOMINAL HYSTERECTOMY N/A 09/04/2012   Procedure: HYSTERECTOMY ABDOMINAL;  Surgeon: Lazaro Arms, MD;  Location: [facility];  Service: Gynecology;  Laterality: N/A;   CESAREAN SECTION     x2   CHOLECYSTECTOMY  03/31/2011   Procedure: LAPAROSCOPIC CHOLECYSTECTOMY;  Surgeon: Dalia Heading, MD;  Location: [facility];  Service: General;  Laterality: N/A;   COLONOSCOPY WITH ESOPHAGOGASTRODUODENOSCOPY (EGD) N/A 01/29/2013   Procedure: COLONOSCOPY WITH  ESOPHAGOGASTRODUODENOSCOPY (EGD);  Surgeon: Malissa Hippo, MD;  Location[facility];  Service: Endoscopy;  Laterality: N/A;  1200-moved to 1250 Ann to notify pt   DILATION AND CURETTAGE OF UTERUS     SALPINGOOPHORECTOMY Bilateral 09/04/2012   Procedure: BILATERAL SALPINGO OOPHORECTOMY;  Surgeon: Lazaro Arms, MD;  Location: [facility];  Service: Gynecology;  Laterality: Bilateral;   TONSILLECTOMY     TUBAL LIGATION      Current Outpatient Medications  Medication Sig Dispense Refill   ALPRAZolam (XANAX) 0.5 MG tablet Take 0.5 mg by mouth 3 (three) times daily as needed for sleep or anxiety. anxiety     amphetamine-dextroamphetamine (ADDERALL) 20 MG tablet Take 20 mg by mouth 2 (two) times daily.   0   aspirin 81 MG chewable tablet Chew by mouth daily.     cholecalciferol (VITAMIN D3) 25 MCG (1000 UNIT) tablet Take 3,000 Units by mouth daily.     cyclobenzaprine (FLEXERIL) 10 MG tablet Take 10 mg by mouth 3 (three) times daily as needed.     Flaxseed, Linseed, (FLAXSEED OIL) 1000 MG CAPS Take 1 capsule by mouth daily.     fluconazole (DIFLUCAN) 100 MG tablet Take 100 mg by mouth daily as needed.     folic acid (FOLVITE) 400 MCG tablet Take 400 mcg by mouth daily.     hydroxychloroquine (PLAQUENIL) 200 MG tablet Take 200 mg by mouth 2 (two) times daily.     methotrexate (RHEUMATREX) 2.5 MG tablet Take 20 mg by mouth once a week. Caution:Chemotherapy. Protect from light. Takes on Wednesday  milk thistle 175 MG tablet Take 175 mg by mouth daily.     Omega-3 Fatty Acids (FISH OIL) 1000 MG CAPS Take 1 capsule by mouth daily.     oxyCODONE-acetaminophen (PERCOCET/ROXICET) 5-325 MG tablet Take 1 tablet by mouth every 6 (six) hours as needed for severe pain.     promethazine (PHENERGAN) 25 MG tablet Take 25 mg by mouth 4 (four) times daily as needed.     valACYclovir (VALTREX) 500 MG tablet Take 500 mg by mouth daily as needed.     zinc gluconate 50 MG tablet Take 50 mg by mouth daily.     No  current facility-administered medications for this visit.   Allergies:  Codeine   Social History: The patient  reports that she has quit smoking. Her smoking use included cigarettes. She has a 30.00 pack-year smoking history. She has never used smokeless tobacco. She reports that she does not drink alcohol and does not use drugs.   Family History: The patient's family history includes COPD in her mother; Cancer in her father; Diabetes in her mother; Heart disease in her mother; Hypertension in her mother; Stroke in her mother.   ROS:  Please see the history of present illness. Otherwise, complete review of systems is positive for none  All other systems are reviewed and negative.   Physical Exam: VS:  BP (!) 150/100 (BP Location: Right Arm, Cuff Size: Normal)   Pulse 80   Ht 5\' 9"  (1.753 m)   Wt 158 lb 9.6 oz (71.9 kg)   LMP 08/30/2012   SpO2 100%   BMI 23.42 kg/m , BMI Body mass index is 23.42 kg/m.  Wt Readings from Last 3 Encounters:  07/07/22 158 lb 9.6 oz (71.9 kg)  04/30/18 148 lb 6.4 oz (67.3 kg)  04/15/18 149 lb (67.6 kg)    General: Patient appears comfortable at rest. HEENT: Conjunctiva and lids normal, oropharynx clear with moist mucosa. Neck: Supple, no elevated JVP or carotid bruits, no thyromegaly. Lungs: Clear to auscultation, nonlabored breathing at rest. Cardiac: Regular rate and rhythm, no S3 or significant systolic murmur, no pericardial rub. Abdomen: Soft, nontender, no hepatomegaly, bowel sounds present, no guarding or rebound. Extremities: No pitting edema, distal pulses 2+. Skin: Warm and dry. Musculoskeletal: No kyphosis. Neuropsychiatric: Alert and oriented x3, affect grossly appropriate.  Recent Labwork: No results found for requested labs within last 365 days.  No results found for: "CHOL", "TRIG", "HDL", "CHOLHDL", "VLDL", "LDLCALC", "LDLDIRECT"  Other Studies Reviewed Today:   Assessment and Plan: Patient is a 47 year old F known to have  dermatomyositis on methotrexate and hydroxychloroquine was referred to cardiology clinic for evaluation of chest pain.  # History of pericarditis in 03/2022 at [facility] ER: Symptoms improved with prednisone and PPI. No recurrence of chest pain since then. Unclear if this is true pericarditis. She was not on colchicine. She is already on opioids for dermatomyositis pain.  # Palpitations: Ongoing for the last 6 months, fluttering mainly at nighttime and occurs few times per week.  Obtain 2-week event monitor    I have spent a total of 30 minutes with patient reviewing chart, EKGs, labs and examining patient as well as establishing an assessment and plan that was discussed with the patient.  > 50% of time was spent in direct patient care.    Medication Adjustments/Labs and Tests Ordered: Current medicines are reviewed at length with the patient today.  Concerns regarding medicines are outlined above.   Tests Ordered: No orders of  the defined types were placed in this encounter.   Medication Changes: No orders of the defined types were placed in this encounter.   Disposition:  Follow up  1 year  Signed Ginni Eichler Verne Spurr, MD, 07/07/2022 10:49 AM    [facility] [address] [place], [place], [place] [address]

## 2022-07-07 NOTE — Telephone Encounter (Signed)
14 Day ZIO XT dx: palpitations 

## 2022-07-07 NOTE — Patient Instructions (Addendum)
Medication Instructions:  Your physician recommends that you continue on your current medications as directed. Please refer to the Current Medication list given to you today.  Labwork: none  Testing/Procedures: Your physician has recommended that you wear a Zio monitor.   This monitor is a medical device that records the heart's electrical activity. Doctors most often use these monitors to diagnose arrhythmias. Arrhythmias are problems with the speed or rhythm of the heartbeat. The monitor is a small device applied to your chest. You can wear one while you do your normal daily activities. While wearing this monitor if you have any symptoms to push the button and record what you felt. Once you have worn this monitor for the period of time provider prescribed (for 14 days), you will return the monitor device in the postage paid box. Once it is returned they will download the data collected and provide us with a report which the provider will then review and we will call you with those results. Important tips:  Avoid showering during the first 24 hours of wearing the monitor. Avoid excessive sweating to help maximize wear time. Do not submerge the device, no hot tubs, and no swimming pools. Keep any lotions or oils away from the patch. After 24 hours you may shower with the patch on. Take brief showers with your back facing the shower head.  Do not remove patch once it has been placed because that will interrupt data and decrease adhesive wear time. Push the button when you have any symptoms and write down what you were feeling. Once you have completed wearing your monitor, remove and place into box which has postage paid and place in your outgoing mailbox.  If for some reason you have misplaced your box then call our office and we can provide another box and/or mail it off for you.  Follow-Up: Your physician recommends that you schedule a follow-up appointment in: 1 year. You will receive a  reminder call in the mail in about 10 months reminding you to call and schedule your appointment. If you don't receive this call, please contact our office.  Any Other Special Instructions Will Be Listed Below (If Applicable).  If you need a refill on your cardiac medications before your next appointment, please call your pharmacy. 

## 2022-07-27 DIAGNOSIS — F900 Attention-deficit hyperactivity disorder, predominantly inattentive type: Secondary | ICD-10-CM | POA: Diagnosis not present

## 2022-07-27 DIAGNOSIS — I10 Essential (primary) hypertension: Secondary | ICD-10-CM | POA: Diagnosis not present

## 2022-07-27 DIAGNOSIS — M3390 Dermatopolymyositis, unspecified, organ involvement unspecified: Secondary | ICD-10-CM | POA: Diagnosis not present

## 2022-07-27 DIAGNOSIS — G894 Chronic pain syndrome: Secondary | ICD-10-CM | POA: Diagnosis not present

## 2022-08-04 DIAGNOSIS — R002 Palpitations: Secondary | ICD-10-CM | POA: Diagnosis not present

## 2022-08-18 DIAGNOSIS — J0140 Acute pansinusitis, unspecified: Secondary | ICD-10-CM | POA: Diagnosis not present

## 2022-08-18 DIAGNOSIS — H6692 Otitis media, unspecified, left ear: Secondary | ICD-10-CM | POA: Diagnosis not present

## 2022-08-28 DIAGNOSIS — I10 Essential (primary) hypertension: Secondary | ICD-10-CM | POA: Diagnosis not present

## 2022-08-28 DIAGNOSIS — H6500 Acute serous otitis media, unspecified ear: Secondary | ICD-10-CM | POA: Diagnosis not present

## 2022-08-28 DIAGNOSIS — F900 Attention-deficit hyperactivity disorder, predominantly inattentive type: Secondary | ICD-10-CM | POA: Diagnosis not present

## 2022-08-28 DIAGNOSIS — G894 Chronic pain syndrome: Secondary | ICD-10-CM | POA: Diagnosis not present

## 2022-11-14 ENCOUNTER — Telehealth: Payer: Self-pay

## 2022-11-14 NOTE — Telephone Encounter (Signed)
Patient informed by voicemail per DPR. Copy sent to PCP

## 2022-11-14 NOTE — Telephone Encounter (Signed)
-----   Message from Kathryn Austin sent at 11/01/2022 10:13 AM EDT ----- Patient symptoms correlated with normal sinus rhythm and ventricular ectopy.  5 runs of brief SVT, not concerning.  No arrhythmias, AV block or pauses.

## 2022-11-16 DIAGNOSIS — F900 Attention-deficit hyperactivity disorder, predominantly inattentive type: Secondary | ICD-10-CM | POA: Diagnosis not present

## 2022-11-16 DIAGNOSIS — G894 Chronic pain syndrome: Secondary | ICD-10-CM | POA: Diagnosis not present

## 2022-11-16 DIAGNOSIS — J0100 Acute maxillary sinusitis, unspecified: Secondary | ICD-10-CM | POA: Diagnosis not present

## 2022-11-16 DIAGNOSIS — M3390 Dermatopolymyositis, unspecified, organ involvement unspecified: Secondary | ICD-10-CM | POA: Diagnosis not present

## 2022-11-29 DIAGNOSIS — F900 Attention-deficit hyperactivity disorder, predominantly inattentive type: Secondary | ICD-10-CM | POA: Diagnosis not present

## 2022-11-29 DIAGNOSIS — G894 Chronic pain syndrome: Secondary | ICD-10-CM | POA: Diagnosis not present

## 2022-11-29 DIAGNOSIS — M3390 Dermatopolymyositis, unspecified, organ involvement unspecified: Secondary | ICD-10-CM | POA: Diagnosis not present

## 2022-11-29 DIAGNOSIS — J0100 Acute maxillary sinusitis, unspecified: Secondary | ICD-10-CM | POA: Diagnosis not present

## 2022-12-01 DIAGNOSIS — Z7289 Other problems related to lifestyle: Secondary | ICD-10-CM | POA: Diagnosis not present

## 2022-12-01 DIAGNOSIS — Z1159 Encounter for screening for other viral diseases: Secondary | ICD-10-CM | POA: Diagnosis not present

## 2022-12-01 DIAGNOSIS — E559 Vitamin D deficiency, unspecified: Secondary | ICD-10-CM | POA: Diagnosis not present

## 2022-12-01 DIAGNOSIS — Z79899 Other long term (current) drug therapy: Secondary | ICD-10-CM | POA: Diagnosis not present

## 2022-12-01 DIAGNOSIS — M3310 Other dermatopolymyositis, organ involvement unspecified: Secondary | ICD-10-CM | POA: Diagnosis not present

## 2023-01-15 ENCOUNTER — Emergency Department (HOSPITAL_COMMUNITY): Payer: Medicare Other

## 2023-01-15 ENCOUNTER — Other Ambulatory Visit: Payer: Self-pay

## 2023-01-15 ENCOUNTER — Encounter (HOSPITAL_COMMUNITY): Payer: Self-pay | Admitting: *Deleted

## 2023-01-15 ENCOUNTER — Emergency Department (HOSPITAL_COMMUNITY)
Admission: EM | Admit: 2023-01-15 | Discharge: 2023-01-16 | Discharge status: Against Medical Advice | Payer: Medicare Other | Attending: Emergency Medicine | Admitting: Emergency Medicine

## 2023-01-15 ENCOUNTER — Telehealth: Payer: Self-pay | Admitting: Internal Medicine

## 2023-01-15 DIAGNOSIS — R0789 Other chest pain: Secondary | ICD-10-CM | POA: Diagnosis not present

## 2023-01-15 DIAGNOSIS — Z5329 Procedure and treatment not carried out because of patient's decision for other reasons: Secondary | ICD-10-CM | POA: Insufficient documentation

## 2023-01-15 DIAGNOSIS — R079 Chest pain, unspecified: Secondary | ICD-10-CM

## 2023-01-15 DIAGNOSIS — R0602 Shortness of breath: Secondary | ICD-10-CM | POA: Diagnosis not present

## 2023-01-15 DIAGNOSIS — I700 Atherosclerosis of aorta: Secondary | ICD-10-CM | POA: Diagnosis not present

## 2023-01-15 DIAGNOSIS — R072 Precordial pain: Secondary | ICD-10-CM | POA: Insufficient documentation

## 2023-01-15 DIAGNOSIS — Z7982 Long term (current) use of aspirin: Secondary | ICD-10-CM | POA: Insufficient documentation

## 2023-01-15 LAB — BASIC METABOLIC PANEL
Anion gap: 10 (ref 5–15)
BUN: 12 mg/dL (ref 6–20)
CO2: 27 mmol/L (ref 22–32)
Calcium: 9 mg/dL (ref 8.9–10.3)
Chloride: 103 mmol/L (ref 98–111)
Creatinine, Ser: 0.65 mg/dL (ref 0.44–1.00)
GFR, Estimated: >60 mL/min (ref >60)
Glucose, Bld: 98 mg/dL (ref 70–99)
Potassium: 3.4 mmol/L — ABNORMAL LOW (ref 3.5–5.1)
Sodium: 140 mmol/L (ref 135–145)

## 2023-01-15 LAB — CBC
HCT: 38.8 % (ref 36.0–46.0)
Hemoglobin: 12.8 g/dL (ref 12.0–15.0)
MCH: 30.3 pg (ref 26.0–34.0)
MCHC: 33 g/dL (ref 30.0–36.0)
MCV: 91.9 fL (ref 80.0–100.0)
Platelets: 386 10*3/uL (ref 150–400)
RBC: 4.22 MIL/uL (ref 3.87–5.11)
RDW: 13.8 % (ref 11.5–15.5)
WBC: 9.1 10*3/uL (ref 4.0–10.5)
nRBC: 0 % (ref 0.0–0.2)

## 2023-01-15 LAB — TROPONIN I (HIGH SENSITIVITY)
Troponin I (High Sensitivity): 16 ng/L (ref <18)
Troponin I (High Sensitivity): 17 ng/L (ref <18)

## 2023-01-15 MED ORDER — IOHEXOL 350 MG/ML SOLN
75.0000 mL | Freq: Once | INTRAVENOUS | Status: AC | PRN
  Administered 2023-01-15: 75 mL via INTRAVENOUS

## 2023-01-15 NOTE — ED Provider Notes (Signed)
EMERGENCY DEPARTMENT AT [facility] Provider Note   CSN: [id number] Arrival date & time: 01/15/23  1502     History {Add pertinent medical, surgical, social history, OB history to HPI:1} Chief Complaint  Patient presents with   Chest Pain    Kathryn Austin is a 47 y.o. female.   Chest Pain Pain location:  Substernal area Pain quality: burning and pressure   Pain radiates to:  Upper back and R shoulder Duration:  1 month Timing:  Intermittent Progression:  Worsening Worsened by:  Deep breathing Associated symptoms: shortness of breath   Risk factors: no coronary artery disease        Home Medications Prior to Admission medications   Medication Sig Start Date End Date Taking? Authorizing Provider  ALPRAZolam Prudy Feeler) 0.5 MG tablet Take 0.5 mg by mouth 3 (three) times daily as needed for sleep or anxiety. anxiety    [provider]  amphetamine-dextroamphetamine (ADDERALL) 20 MG tablet Take 20 mg by mouth 2 (two) times daily.  09/14/14   [provider]  aspirin 81 MG chewable tablet Chew by mouth daily.    [provider]  cholecalciferol (VITAMIN D3) 25 MCG (1000 UNIT) tablet Take 3,000 Units by mouth daily.    [provider]  cyclobenzaprine (FLEXERIL) 10 MG tablet Take 10 mg by mouth 3 (three) times daily as needed. 06/29/22   [provider]  Flaxseed, Linseed, (FLAXSEED OIL) 1000 MG CAPS Take 1 capsule by mouth daily.    [provider]  fluconazole (DIFLUCAN) 100 MG tablet Take 100 mg by mouth daily as needed. 07/06/22   [provider]  folic acid (FOLVITE) 400 MCG tablet Take 400 mcg by mouth daily.    [provider]  hydroxychloroquine (PLAQUENIL) 200 MG tablet Take 200 mg by mouth 2 (two) times daily.    [provider]  methotrexate (RHEUMATREX) 2.5 MG tablet Take 20 mg by mouth once a week. Caution:Chemotherapy. Protect from light. Takes on Wednesday    [provider]  milk thistle 175 MG tablet Take 175 mg by mouth daily.    [provider]  Omega-3 Fatty Acids (FISH OIL) 1000 MG CAPS Take 1 capsule by mouth daily.    [provider]  oxyCODONE-acetaminophen (PERCOCET/ROXICET) 5-325 MG tablet Take 1 tablet by mouth every 6 (six) hours as needed for severe pain.    [provider]  promethazine (PHENERGAN) 25 MG tablet Take 25 mg by mouth 4 (four) times daily as needed. 05/31/22   [provider]  valACYclovir (VALTREX) 500 MG tablet Take 500 mg by mouth daily as needed. 04/24/22   [provider]  zinc gluconate 50 MG tablet Take 50 mg by mouth daily.    [provider]      Allergies    Codeine    Review of Systems   Review of Systems  Respiratory:  Positive for shortness of breath.   Cardiovascular:  Positive for chest pain.    Physical Exam Updated Vital Signs BP (!) 169/91   Pulse (!) 105   Temp 98.7 F (37.1 C) (Oral)   Resp 16   Ht 1.702 m (5\' 7" )   Wt 68 kg   LMP 08/30/2012   SpO2 100%   BMI 23.49 kg/m  Physical Exam  ED Results / Procedures / Treatments   Labs (all labs ordered are listed, but only abnormal results are displayed) Labs Reviewed  BASIC METABOLIC PANEL - Abnormal;  Notable for the following components:      Result Value   Potassium 3.4 (*)    All other components within normal limits  CBC  TROPONIN I (HIGH SENSITIVITY)  TROPONIN I (HIGH SENSITIVITY)    EKG EKG Interpretation Date/Time:  Monday January 15 2023 15:29:50 EST Ventricular Rate:  105 PR Interval:  134 QRS Duration:  88 QT Interval:  358 QTC Calculation: 473 R Axis:   77  Text Interpretation: Sinus tachycardia ST & T wave abnormality, consider inferior ischemia ST & T wave abnormality, consider anterolateral ischemia Abnormal ECG When compared with ECG of 26-Jul-2012 20:09, T wave inversion now evident in Inferior leads T wave inversion now evident in Anterolateral leads  Confirmed by Linwood Dibbles [phone]) on 01/15/2023 3:32:08 PM  Radiology DG Chest 2 View  Result Date: 01/15/2023 CLINICAL DATA:  Chest pain. EXAM: CHEST - 2 VIEW COMPARISON:  08/13/2017. FINDINGS: Bilateral lung fields are clear. Bilateral costophrenic angles are clear. Normal cardio-mediastinal silhouette. No acute osseous abnormalities. The soft tissues are within normal limits. IMPRESSION: *No active cardiopulmonary disease. Electronically Signed   By: Jules Schick M.D.   On: 01/15/2023 17:09    Procedures Procedures  {Document cardiac monitor, telemetry assessment procedure when appropriate:1}  Medications Ordered in ED Medications - No data to display  ED Course/ Medical Decision Making/ A&P   {   Click here for ABCD2, HEART and other calculatorsREFRESH Note before signing :1}                              Medical Decision Making Amount and/or Complexity of Data Reviewed Labs: ordered. Radiology: ordered.   ***  {Document critical care time when appropriate:1} {Document review of labs and clinical decision tools ie heart score, Chads2Vasc2 etc:1}  {Document your independent review of radiology images, and any outside records:1} {Document your discussion with family members, caretakers, and with consultants:1} {Document social determinants of health affecting pt's care:1} {Document your decision making why or why not admission, treatments were needed:1} Final Clinical Impression(s) / ED Diagnoses Final diagnoses:  None    Rx / DC Orders ED Discharge Orders     None

## 2023-01-15 NOTE — ED Notes (Signed)
Patient transported to CT 

## 2023-01-15 NOTE — ED Triage Notes (Signed)
Pt c/o chest pain for the last month but today was worse; pt states she went to her PCP and had an EKG done today and was told to come to ED to have enzymes drawn due to abnormal EKG at PCP office  Pt also c/o sob and headache

## 2023-01-16 ENCOUNTER — Telehealth: Payer: Self-pay | Admitting: *Deleted

## 2023-01-16 DIAGNOSIS — Z8679 Personal history of other diseases of the circulatory system: Secondary | ICD-10-CM

## 2023-01-16 DIAGNOSIS — R079 Chest pain, unspecified: Secondary | ICD-10-CM

## 2023-01-16 MED ORDER — ACETAMINOPHEN 325 MG PO TABS
650.0000 mg | ORAL_TABLET | Freq: Once | ORAL | Status: AC
  Administered 2023-01-16: 650 mg via ORAL
  Filled 2023-01-16: qty 2

## 2023-01-16 NOTE — Telephone Encounter (Signed)
-----   Message from Vishnu P Mallipeddi sent at 01/16/2023 10:16 AM EST ----- Regarding: Obtain ESR and CRP Dr Sherwood Gambler called me yesterday requesting me to see this patient in the ER due to chest pain and abnormal EKG/ Obtain ESR and CRP. 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 Schedule clinic visit with me sometime this week in [place]. Okay to schedule on Thursday when I am off in the PM.

## 2023-01-16 NOTE — ED Notes (Signed)
Patient requesting pain medication for headache. - Provider notified.

## 2023-01-16 NOTE — ED Provider Notes (Signed)
Patient signed out at change of shift pending CTA.  CTA reviewed and shows no evidence of PE.  Troponins negative.  Chest x-ray reassuring.  Given EKG findings, would recommend follow-up with cardiology.  Unfortunately, patient left prior to my reevaluation.  I was not informed of her leaving.   Shon Baton, MD 01/16/23 [phone]

## 2023-01-16 NOTE — ED Notes (Signed)
Patient presents to nurse's station and requests to sign AMA form due to being here for so long. Patient states "my doctor told me to stay for all of the tests and I've done that, I am ready to go". Patient verbalizes understanding of risks of leaving AMA. Form signed prior to leaving. IV removed by patient. MD made aware

## 2023-01-16 NOTE — Telephone Encounter (Signed)
Pt notified of the need for lab work and appt.

## 2023-01-17 ENCOUNTER — Other Ambulatory Visit (HOSPITAL_COMMUNITY)
Admission: RE | Admit: 2023-01-17 | Discharge: 2023-01-17 | Disposition: A | Discharge status: Home / Self Care | Payer: Medicare Other | Source: Ambulatory Visit | Attending: Internal Medicine | Admitting: Internal Medicine

## 2023-01-17 DIAGNOSIS — R079 Chest pain, unspecified: Secondary | ICD-10-CM

## 2023-01-17 DIAGNOSIS — Z8679 Personal history of other diseases of the circulatory system: Secondary | ICD-10-CM

## 2023-01-17 LAB — C-REACTIVE PROTEIN: CRP: 2.5 mg/dL — ABNORMAL HIGH (ref <1.0)

## 2023-01-17 LAB — SEDIMENTATION RATE: Sed Rate: 19 mm/h (ref 0–22)

## 2023-01-19 ENCOUNTER — Ambulatory Visit: Payer: Medicare Other | Attending: Internal Medicine | Admitting: Internal Medicine

## 2023-01-19 VITALS — BP 136/80 | HR 105 | Ht 68.0 in | Wt 156.0 lb

## 2023-01-19 DIAGNOSIS — R079 Chest pain, unspecified: Secondary | ICD-10-CM | POA: Insufficient documentation

## 2023-01-19 DIAGNOSIS — I300 Acute nonspecific idiopathic pericarditis: Secondary | ICD-10-CM | POA: Diagnosis not present

## 2023-01-19 DIAGNOSIS — Z8679 Personal history of other diseases of the circulatory system: Secondary | ICD-10-CM | POA: Diagnosis not present

## 2023-01-19 MED ORDER — ASPIRIN 325 MG PO TBEC: 975.0000 mg | DELAYED_RELEASE_TABLET | Freq: Three times a day (TID) | ORAL | 0 refills | Status: AC

## 2023-01-19 MED ORDER — METOPROLOL TARTRATE 25 MG PO TABS: 25.0000 mg | ORAL_TABLET | Freq: Every day | ORAL | 3 refills | Status: DC | PRN

## 2023-01-19 MED ORDER — COLCHICINE 0.6 MG PO TABS: 0.6000 mg | ORAL_TABLET | Freq: Two times a day (BID) | ORAL | 2 refills | Status: DC

## 2023-01-19 MED ORDER — OMEPRAZOLE 20 MG PO CPDR: 20.0000 mg | DELAYED_RELEASE_CAPSULE | Freq: Every day | ORAL | 11 refills | Status: AC

## 2023-01-19 NOTE — Patient Instructions (Addendum)
Medication Instructions:  Start Colchicine 0.6 mg Two Times Daily  Start Omeprazole 20 mg Daily While on Aspirin  Start Aspirin 975 mg Three Times Daily for 2 Weeks  May take Metoprolol Tartrate 25 mg as needed for elevated heart rate.   *If you need a refill on your cardiac medications before your next appointment, please call your pharmacy*   Lab Work: Your physician recommends that you return for lab work in: 2 Weeks at [facility].    If you have labs (blood work) drawn today and your tests are completely normal, you will receive your results only by: MyChart Message (if you have MyChart) OR A paper copy in the mail If you have any lab test that is abnormal or we need to change your treatment, we will call you to review the results.   Testing/Procedures: Your physician has requested that you have an echocardiogram. Echocardiography is a painless test that uses sound waves to create images of your heart. It provides your doctor with information about the size and shape of your heart and how well your heart's chambers and valves are working. This procedure takes approximately one hour. There are no restrictions for this procedure. Please do NOT wear cologne, perfume, aftershave, or lotions (deodorant is allowed). Please arrive 15 minutes prior to your appointment time.  Please note: We ask at that you not bring children with you during ultrasound (echo/ vascular) testing. Due to room size and safety concerns, children are not allowed in the ultrasound rooms during exams. Our front office staff cannot provide observation of children in our lobby area while testing is being conducted. An adult accompanying a patient to their appointment will only be allowed in the ultrasound room at the discretion of the ultrasound technician under special circumstances. We apologize for any inconvenience.     Follow-Up: At [facility], you and your health needs are our priority.  As  part of our continuing mission to provide you with exceptional heart care, we have created designated Provider Care Teams.  These Care Teams include your primary Cardiologist (physician) and Advanced Practice Providers (APPs -  Physician Assistants and Nurse Practitioners) who all work together to provide you with the care you need, when you need it.  We recommend signing up for the patient portal called "MyChart".  Sign up information is provided on this After Visit Summary.  MyChart is used to connect with patients for Virtual Visits (Telemedicine).  Patients are able to view lab/test results, encounter notes, upcoming appointments, etc.  Non-urgent messages can be sent to your provider as well.   To learn more about what you can do with MyChart, go to [email or web address].    Your next appointment:   3 week(s)  Provider:   Luane School, MD    Other Instructions Thank you for choosing [facility]!

## 2023-01-22 DIAGNOSIS — I300 Acute nonspecific idiopathic pericarditis: Secondary | ICD-10-CM | POA: Insufficient documentation

## 2023-01-22 NOTE — Progress Notes (Signed)
Cardiology Office Note  Date: 01/22/2023   ID: Kathryn Austin, DOB December 09, 1975, MRN [id number]  PCP:  Elfredia Nevins, MD  Cardiologist:  Marjo Bicker, MD Electrophysiologist:  None   History of Present Illness: Kathryn Austin is a 47 y.o. female known to have dermatomyositis is here for follow-up visit.  Patient was sent to ER from PCPs office due to diffuse T wave inversions and worsening chest pain with SOB.  Troponins were within normal limits.  EKG showed diffuse T wave inversions.  She does have resting and exertional chest pains, almost 4-5 times per week and last for minutes to hour.  Started 6 months ago and getting worse.  Her resting HR was also mildly elevated, 100s.  She was previously treated for acute pericarditis at [facility] in 03/2022, received 10-day course of steroids and steroid dose was not tapered but stopped abruptly (per instructions from [facility] ER).  ESR was within normal limits and CRP was mildly elevated, 2.5.   Past Medical History:  Diagnosis Date   Active smoker 2014   Anxiety    Dermatomyositis (HCC)    GERD (gastroesophageal reflux disease)    Hypertension    Neuromuscular disorder (HCC)    dramata myocytis    Past Surgical History:  Procedure Laterality Date   ABDOMINAL HYSTERECTOMY N/A 09/04/2012   Procedure: HYSTERECTOMY ABDOMINAL;  Surgeon: Lazaro Arms, MD;  Location: [facility] ORS;  Service: Gynecology;  Laterality: N/A;   CESAREAN SECTION     x2   CHOLECYSTECTOMY  03/31/2011   Procedure: LAPAROSCOPIC CHOLECYSTECTOMY;  Surgeon: Dalia Heading, MD;  Location: [facility] ORS;  Service: General;  Laterality: N/A;   COLONOSCOPY WITH ESOPHAGOGASTRODUODENOSCOPY (EGD) N/A 01/29/2013   Procedure: COLONOSCOPY WITH ESOPHAGOGASTRODUODENOSCOPY (EGD);  Surgeon: Malissa Hippo, MD;  Location: [facility] ENDO SUITE;  Service: Endoscopy;  Laterality: N/A;  1200-moved to 1250 Ann to notify pt   DILATION AND CURETTAGE OF UTERUS     SALPINGOOPHORECTOMY Bilateral 09/04/2012    Procedure: BILATERAL SALPINGO OOPHORECTOMY;  Surgeon: Lazaro Arms, MD;  Location: [facility] ORS;  Service: Gynecology;  Laterality: Bilateral;   TONSILLECTOMY     TUBAL LIGATION      Current Outpatient Medications  Medication Sig Dispense Refill   albuterol (VENTOLIN HFA) 108 (90 Base) MCG/ACT inhaler Inhale into the lungs.     ALPRAZolam (XANAX) 0.5 MG tablet Take 0.5 mg by mouth 3 (three) times daily as needed for sleep or anxiety. anxiety     aspirin EC 325 MG tablet Take 3 tablets (975 mg total) by mouth 3 (three) times daily. 126 tablet 0   cholecalciferol (VITAMIN D3) 25 MCG (1000 UNIT) tablet Take 3,000 Units by mouth daily.     colchicine 0.6 MG tablet Take 1 tablet (0.6 mg total) by mouth 2 (two) times daily. 60 tablet 2   cyclobenzaprine (FLEXERIL) 10 MG tablet Take 10 mg by mouth 3 (three) times daily as needed.     Flaxseed, Linseed, (FLAXSEED OIL) 1000 MG CAPS Take 1 capsule by mouth daily.     folic acid (FOLVITE) 400 MCG tablet Take 400 mcg by mouth daily.     hydroxychloroquine (PLAQUENIL) 200 MG tablet Take 200 mg by mouth 2 (two) times daily.     methotrexate (RHEUMATREX) 2.5 MG tablet Take 20 mg by mouth once a week. Caution:Chemotherapy. Protect from light. Takes on Wednesday     metoprolol tartrate (LOPRESSOR) 25 MG tablet Take 1 tablet (25 mg total) by mouth daily as needed.  30 tablet 3   milk thistle 175 MG tablet Take 175 mg by mouth daily.     Omega-3 Fatty Acids (FISH OIL) 1000 MG CAPS Take 1 capsule by mouth daily.     omeprazole (PRILOSEC) 20 MG capsule Take 1 capsule (20 mg total) by mouth daily. While on Aspirin 30 capsule 11   oxyCODONE-acetaminophen (PERCOCET/ROXICET) 5-325 MG tablet Take 1 tablet by mouth every 6 (six) hours as needed for severe pain.     promethazine (PHENERGAN) 25 MG tablet Take 25 mg by mouth 4 (four) times daily as needed.     valACYclovir (VALTREX) 500 MG tablet Take 500 mg by mouth daily as needed.     zinc gluconate 50 MG tablet Take 50 mg  by mouth daily.     amphetamine-dextroamphetamine (ADDERALL) 20 MG tablet Take 20 mg by mouth 2 (two) times daily.  (Patient not taking: Reported on 01/19/2023)  0   fluconazole (DIFLUCAN) 100 MG tablet Take 100 mg by mouth daily as needed. (Patient not taking: Reported on 01/19/2023)     No current facility-administered medications for this visit.   Allergies:  Codeine   Social History: The patient  reports that she has quit smoking. Her smoking use included cigarettes. She has a 30 pack-year smoking history. She has never used smokeless tobacco. She reports that she does not drink alcohol and does not use drugs.   Family History: The patient's family history includes COPD in her mother; Cancer in her father; Diabetes in her mother; Heart disease in her mother; Hypertension in her mother; Stroke in her mother.   ROS:  Please see the history of present illness. Otherwise, complete review of systems is positive for none.  All other systems are reviewed and negative.   Physical Exam: VS:  BP 136/80   Pulse (!) 105   Ht 5\' 8"  (1.727 m)   Wt 156 lb (70.8 kg)   LMP 08/30/2012   SpO2 98%   BMI 23.72 kg/m , BMI Body mass index is 23.72 kg/m.  Wt Readings from Last 3 Encounters:  01/19/23 156 lb (70.8 kg)  01/15/23 150 lb (68 kg)  07/07/22 158 lb 9.6 oz (71.9 kg)    General: Patient appears comfortable at rest. HEENT: Conjunctiva and lids normal, oropharynx clear with moist mucosa. Neck: Supple, no elevated JVP or carotid bruits, no thyromegaly. Lungs: Clear to auscultation, nonlabored breathing at rest. Cardiac: Regular rate and rhythm, no S3 or significant systolic murmur, no pericardial rub. Abdomen: Soft, nontender, no hepatomegaly, bowel sounds present, no guarding or rebound. Extremities: No pitting edema, distal pulses 2+. Skin: Warm and dry. Musculoskeletal: No kyphosis. Neuropsychiatric: Alert and oriented x3, affect grossly appropriate.  Recent Labwork: 01/15/2023: BUN 12;  Creatinine, Ser 0.65; Hemoglobin 12.8; Platelets 386; Potassium 3.4; Sodium 140  No results found for: "CHOL", "TRIG", "HDL", "CHOLHDL", "VLDL", "LDLCALC", "LDLDIRECT"   Assessment and Plan:  Recurrent pericarditis: History of pericarditis in 03/2022 wherein the symptoms were resolved with the 10-day course of prednisone.  She had recurrence of chest pressure associated with SOB x 6 months that has been progressively getting worse.  EKG was performed at her PCPs office that showed diffuse T wave inversions, was sent to ER, had negative troponins.  ESR was normal but CRP was elevated, 2.5. Will start colchicine 0.6 mg twice daily x 6 months and high-dose aspirin 1000 mg TID x [redacted] weeks along with PPI.  Will need to repeat ESR and CRP after 2 weeks.  Will  prescribe metoprolol for rate to be taken as needed for palpitations.  Her resting HR is elevated, 100s likely secondary to pericarditis.  Will see her back in 3 weeks. Obtain 2D echocardiogram to rule out any pericardial effusion.  She probably might benefit from cardiac MRI for further evaluation of pericarditis and rule out any constrictive pathology.  Dermatomyositis on hydroxychloroquine and methotrexate: Follows with PCP.    Medication Adjustments/Labs and Tests Ordered: Current medicines are reviewed at length with the patient today.  Concerns regarding medicines are outlined above.    Disposition:  Follow up 3 weeks  Signed, Jashad Depaula Verne Spurr, MD, [facility] [address] [place], [place] [address]

## 2023-01-27 DIAGNOSIS — I10 Essential (primary) hypertension: Secondary | ICD-10-CM | POA: Diagnosis not present

## 2023-01-27 DIAGNOSIS — I010 Acute rheumatic pericarditis: Secondary | ICD-10-CM | POA: Diagnosis not present

## 2023-02-05 ENCOUNTER — Ambulatory Visit (HOSPITAL_COMMUNITY): Admission: RE | Admit: 2023-02-05 | Payer: Medicare Other | Source: Ambulatory Visit

## 2023-02-07 ENCOUNTER — Other Ambulatory Visit (HOSPITAL_COMMUNITY): Payer: Medicare Other

## 2023-02-09 ENCOUNTER — Ambulatory Visit: Payer: Medicare Other | Admitting: Internal Medicine

## 2023-02-19 ENCOUNTER — Ambulatory Visit (HOSPITAL_COMMUNITY): Admission: RE | Admit: 2023-02-19 | Payer: Medicare Other | Source: Ambulatory Visit

## 2023-02-27 DIAGNOSIS — I010 Acute rheumatic pericarditis: Secondary | ICD-10-CM | POA: Diagnosis not present

## 2023-02-27 DIAGNOSIS — I10 Essential (primary) hypertension: Secondary | ICD-10-CM | POA: Diagnosis not present

## 2023-03-19 DIAGNOSIS — I10 Essential (primary) hypertension: Secondary | ICD-10-CM | POA: Diagnosis not present

## 2023-03-19 DIAGNOSIS — G894 Chronic pain syndrome: Secondary | ICD-10-CM | POA: Diagnosis not present

## 2023-03-19 DIAGNOSIS — F900 Attention-deficit hyperactivity disorder, predominantly inattentive type: Secondary | ICD-10-CM | POA: Diagnosis not present

## 2023-03-19 DIAGNOSIS — N183 Chronic kidney disease, stage 3 unspecified: Secondary | ICD-10-CM | POA: Diagnosis not present

## 2023-03-22 ENCOUNTER — Ambulatory Visit (HOSPITAL_COMMUNITY): Admission: RE | Admit: 2023-03-22 | Payer: Medicare Other | Source: Ambulatory Visit

## 2023-05-04 DIAGNOSIS — F900 Attention-deficit hyperactivity disorder, predominantly inattentive type: Secondary | ICD-10-CM | POA: Diagnosis not present

## 2023-05-04 DIAGNOSIS — M3390 Dermatopolymyositis, unspecified, organ involvement unspecified: Secondary | ICD-10-CM | POA: Diagnosis not present

## 2023-05-04 DIAGNOSIS — G894 Chronic pain syndrome: Secondary | ICD-10-CM | POA: Diagnosis not present

## 2023-05-04 DIAGNOSIS — I10 Essential (primary) hypertension: Secondary | ICD-10-CM | POA: Diagnosis not present

## 2023-06-01 ENCOUNTER — Telehealth: Payer: Self-pay | Admitting: Internal Medicine

## 2023-06-01 NOTE — Telephone Encounter (Signed)
 A new order needs to be put in for patient so that she can schedule her echo. Please advise

## 2023-06-01 NOTE — Telephone Encounter (Signed)
 Echo order reinstated- pt no showed last appt. Patient notified and call was routed to [facility]. [place], PAA to schedule.

## 2023-06-21 ENCOUNTER — Ambulatory Visit (HOSPITAL_COMMUNITY)
Admission: RE | Admit: 2023-06-21 | Discharge: 2023-06-21 | Disposition: A | Discharge status: Home / Self Care | Source: Ambulatory Visit | Attending: Internal Medicine | Admitting: Internal Medicine

## 2023-06-21 DIAGNOSIS — R079 Chest pain, unspecified: Secondary | ICD-10-CM | POA: Insufficient documentation

## 2023-06-21 DIAGNOSIS — Z8679 Personal history of other diseases of the circulatory system: Secondary | ICD-10-CM | POA: Insufficient documentation

## 2023-06-21 LAB — ECHOCARDIOGRAM COMPLETE
AR max vel: 2.31 cm2
AV Area VTI: 2.2 cm2
AV Area mean vel: 2.07 cm2
AV Mean grad: 2.9 mmHg
AV Peak grad: 6.5 mmHg
Ao pk vel: 1.28 m/s
Area-P 1/2: 4.06 cm2
S' Lateral: 2.1 cm

## 2023-06-21 NOTE — Progress Notes (Signed)
*  PRELIMINARY RESULTS* Echocardiogram 2D Echocardiogram has been performed.  Bernis Brisker 06/21/2023, 4:01 PM

## 2023-07-05 ENCOUNTER — Other Ambulatory Visit: Payer: Self-pay

## 2023-07-05 MED ORDER — METOPROLOL TARTRATE 25 MG PO TABS: 25.0000 mg | ORAL_TABLET | Freq: Every day | ORAL | 3 refills | Status: AC | PRN

## 2023-07-05 MED ORDER — COLCHICINE 0.6 MG PO TABS: 0.6000 mg | ORAL_TABLET | Freq: Two times a day (BID) | ORAL | 2 refills | Status: AC

## 2023-10-11 ENCOUNTER — Ambulatory Visit: Attending: Internal Medicine | Admitting: Internal Medicine

## 2023-10-11 NOTE — Progress Notes (Signed)
 Erroneous encounter - please disregard.

## 2023-11-02 DIAGNOSIS — F900 Attention-deficit hyperactivity disorder, predominantly inattentive type: Secondary | ICD-10-CM | POA: Diagnosis not present

## 2023-11-02 DIAGNOSIS — G894 Chronic pain syndrome: Secondary | ICD-10-CM | POA: Diagnosis not present

## 2023-11-02 DIAGNOSIS — I10 Essential (primary) hypertension: Secondary | ICD-10-CM | POA: Diagnosis not present

## 2023-11-02 DIAGNOSIS — M130 Polyarthritis, unspecified: Secondary | ICD-10-CM | POA: Diagnosis not present

## 2023-11-02 DIAGNOSIS — M3390 Dermatopolymyositis, unspecified, organ involvement unspecified: Secondary | ICD-10-CM | POA: Diagnosis not present

## 2023-11-22 DIAGNOSIS — Z131 Encounter for screening for diabetes mellitus: Secondary | ICD-10-CM | POA: Diagnosis not present

## 2023-11-22 DIAGNOSIS — M3319 Other dermatopolymyositis with other organ involvement: Secondary | ICD-10-CM | POA: Diagnosis not present

## 2023-11-22 DIAGNOSIS — D849 Immunodeficiency, unspecified: Secondary | ICD-10-CM | POA: Diagnosis not present

## 2023-11-22 DIAGNOSIS — Z13220 Encounter for screening for lipoid disorders: Secondary | ICD-10-CM | POA: Diagnosis not present

## 2023-11-27 ENCOUNTER — Encounter (INDEPENDENT_AMBULATORY_CARE_PROVIDER_SITE_OTHER): Payer: Self-pay | Admitting: *Deleted

## 2023-12-12 ENCOUNTER — Encounter (INDEPENDENT_AMBULATORY_CARE_PROVIDER_SITE_OTHER): Payer: Self-pay | Admitting: Gastroenterology

## 2023-12-26 ENCOUNTER — Telehealth: Payer: Self-pay | Admitting: Internal Medicine

## 2023-12-26 NOTE — Telephone Encounter (Signed)
FYI.  °Contacted patient regarding recall appointment, patient notified our office they did not wish to keep this appointment at this time.  Deleted recall from system. °

## 2024-01-31 DIAGNOSIS — M2550 Pain in unspecified joint: Secondary | ICD-10-CM | POA: Diagnosis not present

## 2024-01-31 DIAGNOSIS — D849 Immunodeficiency, unspecified: Secondary | ICD-10-CM | POA: Diagnosis not present

## 2024-01-31 DIAGNOSIS — F419 Anxiety disorder, unspecified: Secondary | ICD-10-CM | POA: Diagnosis not present
